# Patient Record
Sex: Female | Born: 1935 | Race: Black or African American | Hispanic: No | Smoking: Former smoker
Health system: Southern US, Community
[De-identification: ages and names within clinical notes are randomized; demographics above are authoritative.]

## PROBLEM LIST (undated history)

## (undated) DIAGNOSIS — I639 Cerebral infarction, unspecified: Secondary | ICD-10-CM

## (undated) DIAGNOSIS — I10 Essential (primary) hypertension: Secondary | ICD-10-CM

## (undated) DIAGNOSIS — G309 Alzheimer's disease, unspecified: Secondary | ICD-10-CM

## (undated) DIAGNOSIS — F0280 Dementia in other diseases classified elsewhere without behavioral disturbance: Secondary | ICD-10-CM

## (undated) HISTORY — PX: VASCULAR SURGERY: SHX849

## (undated) HISTORY — PX: TUBAL LIGATION: SHX77

---

## 1997-10-17 ENCOUNTER — Inpatient Hospital Stay (HOSPITAL_COMMUNITY): Admission: EM | Admit: 1997-10-17 | Discharge: 1997-10-22 | Payer: Self-pay | Admitting: Emergency Medicine

## 1997-10-17 ENCOUNTER — Encounter: Payer: Self-pay | Admitting: Gastroenterology

## 1997-10-20 ENCOUNTER — Encounter: Payer: Self-pay | Admitting: Gastroenterology

## 1998-06-26 ENCOUNTER — Emergency Department (HOSPITAL_COMMUNITY): Admission: EM | Admit: 1998-06-26 | Discharge: 1998-06-26 | Payer: Self-pay | Admitting: Emergency Medicine

## 1999-11-17 ENCOUNTER — Encounter: Payer: Self-pay | Admitting: Emergency Medicine

## 1999-11-17 ENCOUNTER — Inpatient Hospital Stay: Admission: EM | Admit: 1999-11-17 | Discharge: 1999-11-23 | Payer: Self-pay | Admitting: Emergency Medicine

## 1999-11-19 ENCOUNTER — Encounter: Payer: Self-pay | Admitting: Internal Medicine

## 1999-11-23 ENCOUNTER — Inpatient Hospital Stay (HOSPITAL_COMMUNITY): Admission: EM | Admit: 1999-11-23 | Discharge: 1999-12-03 | Payer: Self-pay | Admitting: *Deleted

## 2000-12-28 ENCOUNTER — Emergency Department (HOSPITAL_COMMUNITY): Admission: AC | Admit: 2000-12-28 | Discharge: 2000-12-28 | Payer: Self-pay

## 2000-12-28 ENCOUNTER — Encounter: Payer: Self-pay | Admitting: Emergency Medicine

## 2001-10-25 ENCOUNTER — Encounter: Payer: Self-pay | Admitting: Neurology

## 2001-10-25 ENCOUNTER — Encounter: Payer: Self-pay | Admitting: Emergency Medicine

## 2001-10-25 ENCOUNTER — Inpatient Hospital Stay (HOSPITAL_COMMUNITY): Admission: EM | Admit: 2001-10-25 | Discharge: 2001-11-02 | Payer: Self-pay | Admitting: Emergency Medicine

## 2001-10-26 ENCOUNTER — Encounter: Payer: Self-pay | Admitting: Cardiology

## 2001-10-26 ENCOUNTER — Encounter: Payer: Self-pay | Admitting: Neurology

## 2001-10-29 ENCOUNTER — Encounter: Payer: Self-pay | Admitting: Neurology

## 2001-10-30 ENCOUNTER — Encounter: Payer: Self-pay | Admitting: Neurology

## 2001-10-30 ENCOUNTER — Encounter: Payer: Self-pay | Admitting: Pediatrics

## 2001-10-31 ENCOUNTER — Encounter: Payer: Self-pay | Admitting: Internal Medicine

## 2001-10-31 ENCOUNTER — Encounter: Payer: Self-pay | Admitting: Neurology

## 2001-11-01 ENCOUNTER — Encounter: Payer: Self-pay | Admitting: Neurology

## 2001-11-01 ENCOUNTER — Encounter: Payer: Self-pay | Admitting: Internal Medicine

## 2003-12-07 ENCOUNTER — Emergency Department (HOSPITAL_COMMUNITY): Admission: EM | Admit: 2003-12-07 | Discharge: 2003-12-07 | Payer: Self-pay | Admitting: Emergency Medicine

## 2004-07-12 ENCOUNTER — Emergency Department (HOSPITAL_COMMUNITY): Admission: EM | Admit: 2004-07-12 | Discharge: 2004-07-12 | Payer: Self-pay | Admitting: Emergency Medicine

## 2005-07-10 ENCOUNTER — Inpatient Hospital Stay (HOSPITAL_COMMUNITY): Admission: EM | Admit: 2005-07-10 | Discharge: 2005-07-14 | Payer: Self-pay | Admitting: Emergency Medicine

## 2005-07-12 ENCOUNTER — Encounter: Payer: Self-pay | Admitting: Vascular Surgery

## 2005-07-12 ENCOUNTER — Encounter (INDEPENDENT_AMBULATORY_CARE_PROVIDER_SITE_OTHER): Payer: Self-pay | Admitting: Cardiology

## 2006-08-22 ENCOUNTER — Inpatient Hospital Stay (HOSPITAL_COMMUNITY): Admission: EM | Admit: 2006-08-22 | Discharge: 2006-08-25 | Payer: Self-pay | Admitting: Emergency Medicine

## 2006-08-22 ENCOUNTER — Ambulatory Visit: Payer: Self-pay | Admitting: Vascular Surgery

## 2006-08-23 ENCOUNTER — Encounter (INDEPENDENT_AMBULATORY_CARE_PROVIDER_SITE_OTHER): Payer: Self-pay | Admitting: Internal Medicine

## 2006-08-23 ENCOUNTER — Ambulatory Visit: Payer: Self-pay | Admitting: Cardiology

## 2006-08-24 ENCOUNTER — Ambulatory Visit: Payer: Self-pay | Admitting: Physical Medicine & Rehabilitation

## 2006-08-25 ENCOUNTER — Ambulatory Visit: Payer: Self-pay | Admitting: Physical Medicine & Rehabilitation

## 2006-08-25 ENCOUNTER — Inpatient Hospital Stay (HOSPITAL_COMMUNITY)
Admission: RE | Admit: 2006-08-25 | Discharge: 2006-09-14 | Payer: Self-pay | Admitting: Physical Medicine & Rehabilitation

## 2006-10-13 ENCOUNTER — Encounter
Admission: RE | Admit: 2006-10-13 | Discharge: 2007-01-11 | Payer: Self-pay | Admitting: Physical Medicine & Rehabilitation

## 2006-12-04 ENCOUNTER — Ambulatory Visit: Payer: Self-pay | Admitting: Physical Medicine & Rehabilitation

## 2007-05-25 ENCOUNTER — Emergency Department (HOSPITAL_COMMUNITY): Admission: EM | Admit: 2007-05-25 | Discharge: 2007-05-25 | Payer: Self-pay | Admitting: Emergency Medicine

## 2008-12-16 ENCOUNTER — Emergency Department (HOSPITAL_COMMUNITY): Admission: EM | Admit: 2008-12-16 | Discharge: 2008-12-16 | Payer: Self-pay | Admitting: Emergency Medicine

## 2009-12-24 ENCOUNTER — Emergency Department (HOSPITAL_COMMUNITY)
Admission: EM | Admit: 2009-12-24 | Discharge: 2009-12-24 | Payer: Self-pay | Source: Home / Self Care | Admitting: Emergency Medicine

## 2010-02-07 ENCOUNTER — Encounter: Payer: Self-pay | Admitting: Physical Medicine & Rehabilitation

## 2010-02-07 ENCOUNTER — Encounter: Payer: Self-pay | Admitting: Internal Medicine

## 2010-04-21 LAB — WOUND CULTURE

## 2010-04-21 LAB — GLUCOSE, CAPILLARY: Glucose-Capillary: 96 mg/dL (ref 70–99)
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
## 2010-06-01 NOTE — Assessment & Plan Note (Signed)
Kaitlyn [name] is back regarding her left medullary stroke.  She was at  a SNF for a short time and has now returned home for the last month.  She is at home with her daughter, who usually is there for supervision.  She walks with her walker and does some furniture walking.  She has had  no issues regarding a fall.  She does complain of some right shoulder  pain, which has been quite chronic.  It seems to be a bit worse,  however, lately, as she has been working on strengthening exercises for  the right.  She complains of some tingling on the left face and somewhat  to the right hand.  Her mood has been up and down, although it has been  better since she has been home.  Her PCP placed her back on Remeron for  her antidepressants.  Appetite has been good.  She sleeps fairly well.  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   MEDICATIONS:  Lisinopril, Lopressor, Aggrenox, Zocor, and Remeron.   REVIEW OF SYSTEMS:  Notable for the above as well as occasional  coughing, weight gain, and night sweats.   SOCIAL HISTORY:  Patient is widowed and living with her daughter, as  mentioned above.   PHYSICAL EXAMINATION:  Blood pressure is 135/62, pulse 76.  Respiratory  rate is 16.  She is satting at 97% on room air.  Patient is pleasant, alert and oriented x3.  Affect is bright and  appropriate.  She walks without her walker and tends to use a wide base  and lean sometimes to the right, sometimes to the left, depending on her  position.  She was fairly impulsive with her walking and sometimes did  not stop to take time.  HEART:  Regular.  CHEST:  Clear.  ABDOMEN:  Soft and nontender.  She is a bit overweight still.  Sensation is somewhat diminished in the right hand and the left face,  although she denies specific complaints in the right leg today.  Strength is generally 5/5 in all four extremities.  She had fair fine  motor  coordination of the upper and lower extremity limbs.  Speech is  clear.  Cranial nerve exam is intact.   ASSESSMENT:  1. Left medullary stroke.  2. Hypertension.  3. History of depression.  4. History of dizziness/gait instability.   PLAN:  1. Continue medications for stroke prophylaxis, including Aggrenox and      Zocor.  2. Continue with home health therapy to completion.  I would like to      see her begin a rigorous home exercise program with her daughter,      who is retiring, will help her with beginning next year.  3. Blood pressure control and antidepressant management per Dr.      Concepcion Elk.  4. I will see her back on an as-needed basis.  She has done quite      well.      Ranelle Oyster, M.D.  Electronically Signed     ZTS/MedQ  D:  12/04/2006 11:40:47  T:  12/04/2006 17:19:51  Job #:  [id number]   cc:   Fleet Contras, M.D.  Fax: [phone]
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
## 2010-06-01 NOTE — Discharge Summary (Signed)
NAMELILO, WALLINGTON          ACCOUNT NO.:  [id number]   MEDICAL RECORD NO.:  [id number]          PATIENT TYPE:  INP   LOCATION:  1430                         FACILITY:  [facility]   PHYSICIAN:  Fleet Contras, M.D.    DATE OF BIRTH:  [birth year]   DATE OF ADMISSION:  08/22/2006  DATE OF DISCHARGE:  08/25/2006                               DISCHARGE SUMMARY   HISTORY OF PRESENT ILLNESS:  Ms. Kreitzer is a 77 year old African  American lady with past medical history significant for hypertension,  depression, hiatal hernia, gastroesophageal reflux disease, who  presented to the emergency room at [facility] with a  12 hour history of dizziness, associated with impairment of her speech  that started night before, when she had presumed that this was going to  go away. She went to bed and woke up at 2:00 a.m. to use the bathroom  and she had to hold the wall. The patient had to crawl on her hands and  knees and got back in bed, thinking this was going to pass. She woke up  at about 5:30 a.m. to use the bathroom and again, she slumped onto her  left side. She was then helped by her daughter, back in bed. They tried  to get her to the daycare center that day but noticed that she could not  stand and decided to bring her into the hospital by ambulance. She does  not have any obvious facial droop. There is no report of any seizures or  syncopal episodes. She did have some nausea and vomiting at one time.  The dizziness was in the form of vertigo with the room spinning around.  She was off her balance. In the emergency room all work up essentially  were negative. Her vital signs were stable. A MRI scan of the brain was  performed and this revealed a subacute stroke in the left medulla and  extensive chronic small vessel disease throughout the brain. An MRA of  the brain was normal. I was contacted to meet her for appropriate  management.   HOSPITAL COURSE:  On admission, she  was placed on telemetry bed, kept  NPO, until cleared by the swallow team. Vital signs were checked q.4  hours. She was started on IV fluids of half normal saline at 5 mL an  hour and Protonix 40 mg daily. Her home medications were continued as  usual. Neurologic consultation was requested and the patient was kindly  seen by Dr. Delia Heady, who thought that the left sided weakness and  gait ataxia was due to left medullary infarct and recommended that she  should be started on Aggrenox for secondary prevention. Efforts were  made to control her blood pressure as tightly as possible. Further  laboratory data including a 2-D echocardiogram, carotid Doppler's were  performed and these essentially were normal without obvious carotid  artery stenosis and normal left ventricular ejection fraction. Physical  therapy and occupational therapy consults were requested and the patient  was recommended for rehabilitation. She was evaluated by rehabilitation  team and was accepted for rehabilitation. On August 25, 2006,  she did  transfer to rehabilitation at [facility] where she was treated  until ready for discharge.   DISCHARGE DIAGNOSES:  1. Left sided medullary infarct.  2. Systemic hypertension. She was started on hydrochlorothiazide 12.5      mg daily.  3. Her depression was well controlled and vertigo had improved.   CONDITION ON DISCHARGE:  Stable.   DISPOSITION:  To move to rehabilitation.   DISCHARGE MEDICATIONS:  1. Protonix 40 mg daily.  2. Lisinopril 20 mg once daily.  3. Nortriptyline 50 mg q.h.s.  4. Remeron 50 mg q.h.s.  5. Aspirin 81 mg daily.  6. Aggrenox 1 p.o. b.i.d.  7. HCTZ 12.5 mg once daily.  8. Meclizine 25 mg q.8 hourly p.r.n. for vertigo.   The plan of care was discussed with the patient and her questions were  answered.      Fleet Contras, M.D.  Electronically Signed     EA/MEDQ  D:  10/08/2006  T:  10/08/2006  Job:  [id number]
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
## 2010-06-04 NOTE — Discharge Summary (Signed)
[facility]  Patient:    Kaitlyn [name], Kaitlyn [name]                 MRN: [id number] Adm. Date:  [id number] Disc. Date: [id number] Attending:  Otilio Saber                           Discharge Summary  BRIEF HISTORY:  Kaitlyn [name] is a 77 year old African-American widowed female admitted with a history of severe depression and drug overdose.  She overdosed on a bottle of Percogesic and was transferred from [facility] for continued treatment of depression.  She had been depressed for several weeks, becoming withdrawn and not feeling like herself.  She reported decreased sleep, decreased appetite with 10 pound weight loss and had increasing suicidal ideation just prior to her overdose.  She had been stressed by the recent death of her husband in 1999/02/06.  She also had two cousins who died in 2022/04/06 and 11/06/1999.  She also had severe financial problems and had been worrying excessively.  PAST PSYCHIATRIC HISTORY:  The patient had no previous inpatient or outpatient treatment.  PAST MEDICAL HISTORY:  She is followed medically by Dr. Mayford Knife at [facility].  She had a history of pancreatitis four weeks prior to admission, hiatal hernia, gastroesophageal reflux disease, rheumatoid arthritis, cataracts, microcytic anemia and migraine headaches.  She recently had a urinary tract infection as well some questionable hepatic toxicity secondary to Tylenol overdose.  MEDICATIONS:  At the time of admission she was on Pepcid 20 mg b.i.d., Septra DS b.i.d., Celexa 20 mg q.a.m. and Desyrel 25 mg q.h.s.  ALLERGIES:  No known drug allergies.  PHYSICAL EXAMINATION:  Physical examination was performed at [facility] during her stay from October 31 through November 23, 1999.  She continued to have elevated liver enzymes.  MENTAL STATUS EXAMINATION:  On admission revealed an older, overweight African-American female who was casually  dressed.  She had some psychomotor retardation.  Speech was very slow and soft with poverty of speech.  Thought processes were logical and coherent without evidence of psychosis.  Mood was sad and affect was tearful.  She had continuing suicidal ideation.  She was oriented x 3.  Cognitive functioning was somewhat impaired.  ADMITTING DIAGNOSES: Axis I:    Major depression, single episode, severe. Axis II:   No diagnosis. Axis III:  1. Migraine headaches.            2. Microcytic anemia.            3. Urinary tract infection.            4. Gastroesophageal reflux disease.            5. History of pancreatitis.            6. Rheumatoid arthritis.            7. Bilateral cataracts.            8. Hepatic toxicity. Axis IV:   Psychosocial stressors severe. Axis V:    Global assessment of functioning 35, highest past year 70.  HOSPITAL COURSE:  The patient was admitted to [facility] for treatment of depression and to prevent any further suicidal acting out.  She remained quite depressed and sad and was continued on Celexa.  We increased her Desyrel.  She was initially noncompliant, stating antidepressants were what was causing her depression.  She was confronted about her overdose and encouraged to take her medications.  We switched her from Desyrel to Remeron. The patient became more compliant with her medication and gradually became somewhat less depressed.  She continued to feel quite anxious and we elected to add a small dose of Zyprexa.  We met with the patient and her son and discussed her condition and discharge plans.  The patient remained depressed and tearful with some delusions, stating she had to go to jail.  She could not talk rationally about her fears, about insurance and her bills.  We increased her Zyprexa.  The patient gradually became calmer and less depressed.  we met with the patients daughter who felt she was back to her baseline.  The patient was  having no further suicidal thoughts and it was felt she could be managed on an outpatient basis.  CONDITION ON DISCHARGE:  The patient was discharged in improved condition with improvement in her mood, sleep and appetite, alleviation of her suicidal ideation and alleviation of her excessive worry.  DISPOSITION:  The patient is discharged home.  She is to follow up at the [facility] on December 15, 1999.  DISCHARGE MEDICATIONS:  Celexa 20 mg p.o. q.a.m., Remeron 30 mg q.h.s., Zyprexa 7.5 mg q.h.s., Pepcid 20 mg b.i.d.  FINAL DIAGNOSES: Axis I:    Major depression, with psychotic features, single episode, severe. Axis II:   No diagnosis. Axis III:  1. Migraine headaches.            2. Microcytic anemia.            3. History of urinary tract infection.            4. Gastroesophageal reflux disease.            5. History of pancreatitis.            6. Rheumatoid arthritis.            7. Bilateral cataracts. Axis IV:   Psychosocial stressors severe. Axis V:    Global assessment of functioning current 50, highest past year 70. D:  02/03/00 TD:  02/03/00 Job: [id number]

## 2010-06-04 NOTE — H&P (Signed)
[facility]  Patient:    Kaitlyn [name], Kaitlyn [name]                 MRN: [id number] Adm. Date:  [id number] Attending:  Otilio Saber Dictator:   Valinda Hoar, N.P.                         History and Physical  IDENTIFYING INFORMATION:  Kaitlyn [name] is a 77 year old African-American widowed female admitted November 23, 1999 on a voluntary basis for severe depression.  The patient overdosed on one bottle of Percogesic last week, approximately November 17, 1999.  She was admitted from [facility] to the [facility] for continued treatment of her depression.  HISTORY OF PRESENT ILLNESS:  The patient reports she has been depressed for the last couple weeks and has been very depressed.  She states she still does not feel like herself.  She is withdrawn.  Collateral information states on the day of the overdose family could not reach her by phone.  They had to break the door down and found her on the floor unresponsive.  They took her to [facility].  The patient does say her sleep continues to be poor, averaging 3-4 hours a night and this has been going on for one year.  Appetite is poor.  She thinks she has lost about 10 pounds in one year.  She states she took the Percogesic pills to stop a headache.  She took them at night and the she says "maybe I wanted to die."  The patient states she still feels suicidal, hopeless and does not want to live.  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  She denies hallucinations, no delusions.  Stressors include the recent death of her husband in 02/22/1999.  Two cousins died in 04-05-2001and 1999/11/22.  She apparently was close to these cousins.  She has had severe financial problems, particularly since her husband died. Her husband was in a nursing home before he died.  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  PAST PSYCHIATRIC HISTORY:  No previous inpatient or outpatient treatment. No history of a suicide attempt.  She does report she got depressed when her mother died two years ago, however, she coped on her own and it resolved.  PAST MEDICAL HISTORY:  Patient states she does not have a primary care doctor. She was recently treated by Dr. Mayford Knife at [facility].  Medical problems include recurrent pancreatitis (last episode four weeks ago), hiatal hernia, gastroesophageal reflux disease, rheumatoid arthritis, cataracts both eyes, microcytic anemia and migraine headaches.  She has a urinary tract infection and question of some hepatotoxicity secondary to the Tylenol overdose.  CURRENT MEDICATIONS:  Pepcid 20 mg b.i.d., Septra DS b.i.d. x 2 days, Celexa 20 mg p.o. q.a.m. and Desyrel 25 mg q.h.s. p.o.  DRUG ALLERGIES:  No known drug allergies.  SOCIAL HISTORY:  The patient lives alone.  Her husband died in 1999-02-22. They had been married for 50 years.  She has two sons and one daughter and the daughter lives next to her.  She has one sister and four brothers.  Two sisters died, probably in infancy.  Her parents are deceased.  She completed the June 22, 2022 grade.  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  She has a strong religious faith.  She is a Curator.  FAMILY HISTORY:  None.  ALCOHOL OR DRUG HISTORY:  She denies alcohol use, denies substance abuse, is a nonsmoker.  POSITIVE PHYSICAL FINDINGS:  Please see physical examination done at [facility] between November 17, 1999 and November 23, 1999.  She was admitted to [facility] after an overdose with Percogesic with hepatotoxicity secondary to the Tylenol overdose.  Her liver enzymes are still not within normal limits and there is some question of liver damage secondary to the overdose.  Again she was at [facility] from November 17, 1999 through November 23, 1999.  VITAL SIGNS:  Current  temperature 98.5, pulse 98, respirations 18, blood pressure 142/72, height 5 feet, weight 188 pounds.  CURRENT MENTAL STATUS EXAMINATION:  An older overweight African-American female who is casually dressed.  She has some psychomotor retardation and moves very slowly.  The patient is cooperative.  Speech is very very low and slow, some poverty of speech and difficult to understand since she speaks in such low tone.  Most of the time her speech is relevant.  Mood is tearful, very sad.  Affect is depressed, positive for suicidal ideation, no homicidal ideation currently.  Thought processes are logical and coherent without evidence of psychosis.  No hallucinations, no delusions.  Cognitively, she is alert and oriented.  Her cognitive functioning appears intact.  We will do a Folstein mini mental status exam to determine how intact it is.  Her judgment is poor.  Insight is poor.  CURRENT DIAGNOSES: Axis I:    Major depression, single episode, severe with suicide attempt. Axis II:   None. Axis III:  1. Migraine headaches.            2. Microcytic anemia.            3. Urinary tract infection.            4. Gastroesophageal reflux disease.            5. History of pancreatitis.            6. Rheumatoid arthritis.            7. Cataracts both eyes.            8. Question of hepatotoxicity secondary to the overdose. Axis IV:   Severe related to problems with primary support group and economic            problems and medical problems. Axis V:    Global assessment of functioning current 35, highest in past year            70.  TREATMENT PLAN AND RECOMMENDATIONS: 1. VOluntary admission to [facility]. 2. Check every 15 minutes, maintain safety.  The patient is able to contract    for safety. 3. Due to her possibility of hepatotoxicity and liver enzymes not within    normal limits, she is going to be on Celexa 20 mg p.o. q.d.  She was on    this at [facility].   Trazodone 25 mg p.o. q.h.s. for sleep.  She was on    this at [facility], Pepcid 20 mg p.o. b.i.d., Septra DS 1 tab p.o. q.12h.    x 2 days.  4. Will try to have the patient attend some groups.  TENTATIVE LENGTH OF STAY:  Three to five days. DD:  11/24/99 TD:  11/24/99 Job: [id number]

## 2010-06-04 NOTE — Discharge Summary (Signed)
[facility]  Patient:    Kaitlyn [name], Kaitlyn [name]                 MRN: [id number] Adm. Date:  [id number] Disc. Date: 11/23/99 Attending:  Miguel Aschoff CC:         [facility]             Kaitlyn [name], M.D.             Kaitlyn [name], M.D.                           Discharge Summary  DATE OF BIRTH:  04-19-1925  DISCHARGE DIAGNOSES: 1. Hepatotoxicity secondary to Tylenol overdose, suicide attempt. 2. Severe major depression. 3. Group B strep (agalactiae) urinary tract infection. 4. Gastroesophageal reflux disease. 5. History of recurrent idiopathic pancreatitis. a)Status post cholecystectomy    in 1997. b) Status post sphincterectomy in 1998. 6. History of microcytic anemia in the past. a) Negative colonoscopy 1997.    b) Hemoglobin 13.6, MCV 87 during this admission. 7. History of leaking planus. 8. Status post hysterectomy. 9. Transient hypokalemia, resolved.  DISCHARGE MEDICATIONS: 1. Pepcid 20 mg p.o. b.i.d. 2. Septra DS 1 table b.i.d. x 2 more days. 3. Celexa 20 mg p.o. q.d. 4. Trazodone 25 mg q.h.s.  DISCHARGE FOLLOW-UP AND DISPOSITION:  Kaitlyn [name] is being transferred to [facility] to continue with the inpatient therapy for her major depression and concurrent suicidal ideation. Upon discharge from this facility, Dr. Madilyn [name], Kaitlyn [name] will continue following this patient from the [name] standpoint including gastroesophageal reflux disease and recurrent pancreatitis.  A psychiatrist will be assigned to this patient prior being discharged from the behavioral health center who will be in charge of managing the patients antidepressive agents.  We gave to Kaitlyn [name] family Kaitlyn [name] office number to arrange an appointment upon discharge if general medical problems were to occur.  We recommend to follow another set of LFTs within 3 days to confirm steady decline. Also a  TSH should be obtained on ______. A urine culture should be repeated within 1-2 weeks to confirm the total resolution of the patients UTI.  CONSULTATIONS:  Kaitlyn [name], psychiatry.  PROCEDURES:  CT scan of the brain, negative.  DISCHARGE LABORATORY DATA:  Alkaline phosphatase 213, SGOT 50, SGPT 109, total bilirubin 0.7, albumin 3.0, sodium 140, potassium 4.1, chloride 108, CO2 27, BUN 17, creatinine 0.8, glucose 107. Urine culture consistent with group B strep (agalactiae). Protime 15.1. Cardiac enzymes negative. Blood culture negative x 1 (final results). Hemoglobin 13.6, MCV 87, WBC 8.3, platelets 202.  HOSPITAL COURSE BY PROBLEM:  Kaitlyn [name] is a very pleasant 77 year old female admitted to [facility] on November 16, 1999 with altered mental status associated with Percogesic overdose in a suicide attempt. Please see admission H&P by Dr. Charissa [name] for further details regarding the history of presentation, physical exam and lab data.  #1 -  SEVERE TYLENOL OVERDOSE ASSOCIATED WITH HEPATOTOXICITY IN A SUICIDE ATTEMPT.  Kaitlyn [name] was admitted without altered mental status associated with the overdose on Percogesic in a suicidal attempt. The Tylenol level 10 hours after injection was 337. The first set of LFTs were within normal limits except a total bilirubin of 1.5. The patient received activated charcoal in the emergency department. ______ was started by protocol. The patients lethargy  started to improve after overnight observation with supportive care. The second set of LFTs started to show an increased ALT which peaked at 234 on day 3 of hospital stay. The ALP also peaked at 222 on November 22, 1999. The following day, this liver function test started to improve. The Tylenol level on day 2 was undetectable. A psychiatry consult was obtained due to the patients severe signs of major depression since the death of her husband in 02-19-22 of this year. Dr.  Jeanie [name] agreed that Kaitlyn [name] had a major depression. The patient was started on Celexa and then trazadone was started prior to her transfer to the [facility]. Kaitlyn [name] agreed with a voluntary commitment to this facility to continue with the intensive therapy for her major depression. At the time of discharge, the patient showed no signs of lethargy. Her liver function tests showed a steady pattern of improvement. She finished with her ______ protocol doses prior to this transfer. Further follow-up on the patients LFTs will be performed at the [facility] within 3 days. Upon discharge, Dr. Barbee Shropshire will be available for general medical follow-up if needed. #2 - HYPOKALEMIA. Kaitlyn [name] had persistent hypokalemia in the 2.9 to 3.4 range throughout this hospital stay. On the day of transfer, the hypokalemia had resolved. No cardiac complications were noted associated with this electrolyte abnormality. #3 - URINARY TRACT INFECTION.  Kaitlyn [name] had a low grade fever associated with dysuria. Urinalysis showed pyuria. The urine culture isolated the strep (agalactiae). Septra was started without complications. Kaitlyn [name] will remain on this medication for 2 more days upon discharge. A urine culture is recommended within 1-2 weeks to confirm the total resolution of this infection.  The rest of the patients medical problems remain stable.  CONDITION ON DISCHARGE:  Improved. DD:  11/23/99 TD:  11/23/99 Job: [id number]
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
## 2010-06-04 NOTE — Discharge Summary (Signed)
NAMEBRILEE, Kaitlyn [name]          ACCOUNT NO.:  [id number]   MEDICAL RECORD NO.:  [id number]          PATIENT TYPE:  INP   LOCATION:  1404                         FACILITY:  [facility]   PHYSICIAN:  Fleet Contras, M.D.    DATE OF BIRTH:  Jul 25, 1925   DATE OF ADMISSION:  07/10/2005  DATE OF DISCHARGE:  07/14/2005                                 DISCHARGE SUMMARY   HISTORY OF PRESENTING ILLNESS:  Kaitlyn [name] is a 77 year old African  American lady with a past medical history significant for depression and  previous cerebrovascular accident.  She presented to the emergency room at  [facility] with sudden onset of dizziness, blank-staring eyes  observed by her daughter, numbness of the left hand of one day's duration.  She was immediately brought to the emergency room for evaluation.  In the  emergency room, she was alert and oriented.  Her vital signs were stable  except for an elevated blood pressure of 155/79.  Her laboratory testing was  normal including a CT scan of the head which was negative for any acute  process.  Glucose was elevated at 145, and hemoglobin A1c was 6.6.  She was  admitted to the hospital for further evaluation and appropriate management.   HOSPITAL COURSE:  On admission, she was placed on telemetry.  Further  laboratory studies were performed including a 2-D echocardiogram of her  heart which showed ejection fraction of 60% without any intracardiac embolus  or thrombus.  There was mild septal hypertrophy.  Also, a carotid Doppler  was performed, and this did not reveal any significant coronary stenosis.  Her homocysteine level was normal at 13.3.  RBC folate 680, vitamin B12 was  767, C-reactive protein was 1.7, sed rate was elevated at 44, hemoglobin A1c  was 6.6, TSH 0.927.  Serial CKs and troponins times three were all negative.  Lipid profile shows total cholesterol 150, triglycerides 45, HDL 45, LDL 96,  and total cholesterol/HDL ratio of 3.3.  She  had occasional episodes of mild  headaches which were relieved by Tylenol.  Her blood pressure was elevated  at times up to 157/78.  She was therefore started on oral lisinopril 10 mg  daily.  She was on aspirin 325 mg once a day since from admission.  She  tolerated this well.  She has had no further episodes of dizziness,  disorientation, paresthesias.  Today, she is feeling much better, and she is  sitting on the edge of the bed eating her breakfast.  Her blood pressure is  120/70.  Chest is clear to auscultation without any other sounds.  Heart:  S1, S2 are heard with no murmurs.  Abdomen is soft and nontender.  No  masses.  Bowel sounds are present.  Extremities show no edema.  No calf  tenderness.  No swelling.  CNS:  She is alert and oriented times three with  no focal neurological deficits.   ASSESSMENT:  Kaitlyn [name] is a 77 year old lady with past medical  history significant for depression and previous cerebrovascular accident  without focal neurological deficit.  She presented this time with  symptoms  suggestive of transient ischemic attack.  Her symptoms have not completely  resolved. The workup, so far, has been negative except for elevated blood  pressure for which she was started on medications.  She also had impaired  glucose tolerance, and she has been placed on a diet without concentrated  sweets.  She is considered appropriate for discharge home today.  Her  condition has been discussed with her daughter, and all questions were  answered.   DISCHARGE DIAGNOSES:  1.  Transient ischemic attack, now resolved.  2.  Hypertension, well controlled.  3.  Depression, stable.  4.  Impaired glucose tolerance with hemoglobin A1c of 6.6.  This will be      monitored in the outpatient department.   DISPOSITION:  Home.   CONDITION ON DISCHARGE:  Stable.   FOLLOW-UP:  Follow-up will be with me in two weeks.   DISCHARGE MEDICATIONS:  1.  Aspirin 325 mg once daily.   2.  Nortriptyline 50 mg at bedtime.  3.  Lisinopril 10 mg daily.      Fleet Contras, M.D.  Electronically Signed     EA/MEDQ  D:  07/14/2005  T:  07/14/2005  Job:  [id number]
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
## 2010-10-13 LAB — URINALYSIS, ROUTINE W REFLEX MICROSCOPIC
Glucose, UA: NEGATIVE
Hgb urine dipstick: NEGATIVE
Nitrite: NEGATIVE
Protein, ur: NEGATIVE
Urobilinogen, UA: 1
pH: 6

## 2010-10-13 LAB — BASIC METABOLIC PANEL
CO2: 29
Calcium: 9.4
GFR calc Af Amer: >60
GFR calc non Af Amer: >60
Sodium: 142

## 2010-10-29 LAB — BASIC METABOLIC PANEL
Calcium: 9
Creatinine, Ser: 0.93
GFR calc Af Amer: >60
Potassium: 4.2

## 2010-11-01 LAB — BASIC METABOLIC PANEL
CO2: 29
CO2: 30
Calcium: 9.1
Chloride: 106
Creatinine, Ser: 0.77
Creatinine, Ser: 0.87
GFR calc non Af Amer: >60
Glucose, Bld: 131 — ABNORMAL HIGH
Glucose, Bld: 148 — ABNORMAL HIGH
Potassium: 4.1
Sodium: 141

## 2010-11-01 LAB — COMPREHENSIVE METABOLIC PANEL
ALT: 43 — ABNORMAL HIGH
AST: 36
Alkaline Phosphatase: 120 — ABNORMAL HIGH
Alkaline Phosphatase: 124 — ABNORMAL HIGH
BUN: 10
CO2: 27
CO2: 28
Calcium: 9.2
Chloride: 106
Creatinine, Ser: 0.79
GFR calc Af Amer: >60
GFR calc non Af Amer: 55 — ABNORMAL LOW
GFR calc non Af Amer: >60
Glucose, Bld: 121 — ABNORMAL HIGH
Potassium: 3.6
Potassium: 4.4
Sodium: 142
Total Bilirubin: 1.1
Total Protein: 5.9 — ABNORMAL LOW

## 2010-11-01 LAB — ANA: Anti Nuclear Antibody(ANA): NEGATIVE

## 2010-11-01 LAB — CBC
HCT: 36.3
HCT: 38.1
Hemoglobin: 12.3
Hemoglobin: 12.9
MCHC: 33.8
MCHC: 33.8
MCV: 90.9
Platelets: 197
RBC: 3.67 — ABNORMAL LOW
RDW: 13.8
WBC: 7.9

## 2010-11-01 LAB — DIFFERENTIAL
Eosinophils Absolute: 0.2
Eosinophils Relative: 3
Lymphs Abs: 2.3
Monocytes Relative: 9

## 2010-11-01 LAB — SEDIMENTATION RATE: Sed Rate: 46 — ABNORMAL HIGH

## 2010-11-01 LAB — HOMOCYSTEINE: Homocysteine: 9.1

## 2010-11-01 LAB — LIPID PANEL
Cholesterol: 137
HDL: 35 — ABNORMAL LOW
Total CHOL/HDL Ratio: 3.9
VLDL: 16

## 2010-11-01 LAB — TSH: TSH: 0.612

## 2010-11-01 LAB — PROTIME-INR
INR: 1
Prothrombin Time: 13

## 2013-05-24 ENCOUNTER — Encounter (HOSPITAL_COMMUNITY): Payer: Self-pay | Admitting: Emergency Medicine

## 2013-05-24 ENCOUNTER — Emergency Department (HOSPITAL_COMMUNITY)
Admission: EM | Admit: 2013-05-24 | Discharge: 2013-05-24 | Disposition: A | Discharge status: Home / Self Care | Payer: Medicare Other | Attending: Emergency Medicine | Admitting: Emergency Medicine

## 2013-05-24 DIAGNOSIS — T413X5A Adverse effect of local anesthetics, initial encounter: Secondary | ICD-10-CM | POA: Insufficient documentation

## 2013-05-24 DIAGNOSIS — R259 Unspecified abnormal involuntary movements: Secondary | ICD-10-CM | POA: Insufficient documentation

## 2013-05-24 HISTORY — DX: Essential (primary) hypertension: I10

## 2013-05-24 HISTORY — DX: Cerebral infarction, unspecified: I63.9

## 2013-05-24 MED ORDER — LORAZEPAM 2 MG/ML IJ SOLN
0.5000 mg | Freq: Once | INTRAMUSCULAR | Status: AC
  Administered 2013-05-24: 0.5 mg via INTRAVENOUS
  Filled 2013-05-24: qty 1

## 2013-05-24 NOTE — ED Notes (Addendum)
Pt still having minimal but frank bright red blood from mouth. Dentures removed per dental instructions with patient from dentist seen today. Mouth repacked with gauze. Awaiting MD re-eval.

## 2013-05-24 NOTE — Discharge Instructions (Signed)

## 2013-05-24 NOTE — ED Provider Notes (Signed)
CSN: [id number]     Arrival date & time 05/24/13  1545 History   First MD Initiated Contact with Patient 05/24/13 1547     Chief Complaint  Patient presents with  . Medication Reaction     (Consider location/radiation/quality/duration/timing/severity/associated sxs/prior Treatment) HPI  80 year old female BIB EMS from dentist office for reported adverse reaction to lidocain given by dentist 1 hr ago.  History obtained through daughter who is at bedside. Patient ha  a dental extraction done this afternoon in preparation for her denture. While receiving the lidocaine injection patient developed body tremors including headshaking, arm shaking and repeatedly banging her head with her arms. Symptoms lasting for about 5 minutes. This has happened before she had her extraction. As cautionary measure, the dentist sent her to the ED for further management.  Aside from dental pain, patient denies fever, chills, headache, chest pain, shortness of breath, abdominal pain, nausea vomiting diarrhea, throat swelling or trouble swallowing. The symptom has resolved and patient is now back to her normal baseline. Patient does have a history of prior stroke with the last stroke in 2004.   No past medical history on file. No past surgical history on file. No family history on file. History  Substance Use Topics  . Smoking status: Not on file  . Smokeless tobacco: Not on file  . Alcohol Use: Not on file   OB History   No data available     Review of Systems  All other systems reviewed and are negative.     Allergies  Review of patient's allergies indicates not on file.  Home Medications   Prior to Admission medications   Not on File   There were no vitals taken for this visit. Physical Exam  Nursing note and vitals reviewed. Constitutional: She is oriented to person, place, and time. She appears well-developed and well-nourished. No distress.  HENT:  Head: Atraumatic.  Several newly  extracted sockets to both upper and lower jaw from recent dental extraction.  Bloody gauze in mouth. No tongue edema, no trismus or throat swelling.  Eyes: Conjunctivae are normal.  Neck: Neck supple.  Cardiovascular: Normal rate, regular rhythm and intact distal pulses.   Pulmonary/Chest: Effort normal and breath sounds normal. She has no wheezes.  Neurological: She is alert and oriented to person, place, and time.  5/5 strength to all 4 extremities.  No tremors noted.    Skin: No rash noted.  Psychiatric: She has a normal mood and affect.    ED Course  Procedures (including critical care time)  Pt has an episode of body tremor lasting 5 min after administration of lidocane and before dental extraction.  Tremor noted at 1300.  No active tremor at this time.  Lido can induce neurologic findings such as tremor.  Pt currently in NAD.  Will monitor.    6:21 PM Pt has several witnessed tremors lasting for 10-15 seconds.  No seizure activity.  Pt also continues to bleed from her dental extraction site.  She was on plavix (one dose was hold last night).  Will continue to monitor.  Care discussed with Dr. Gwendolyn GrantWalden.    8:57 PM Pt is currently sxs free.  She has been monitored for the past 5 hrs.  She is stable for discharge.  Pt and family member agrees with plan.  Pt to f/u with PCP as needed.  Pt will need to notify provider of her lidocaine allergy.  Return precaution discussed.    Labs Review  Labs Reviewed - No data to display  Imaging Review No results found.   EKG Interpretation None      MDM   Final diagnoses:  Local anesthetic drug adverse reaction    BP 140/60  Pulse 92  Temp(Src) 97.9 F (36.6 C) (Oral)  Resp 18  SpO2 98%     Fayrene HelperBowie Nikash Mortensen, PA-C 05/24/13 2059

## 2013-05-24 NOTE — ED Notes (Signed)
Pt brought in by EMS from Dentist, reports adverse reaction to Licodane after pt had tremors. Lung sounds clear, pt in NAD.

## 2013-05-24 NOTE — ED Provider Notes (Signed)
Medical screening examination/treatment/procedure(s) were conducted as a shared visit with non-physician practitioner(s) and myself.  I personally evaluated the patient during the encounter.   EKG Interpretation None       [name] here s/p dental extraction. Had episode of arm shaking without loss of consciousness/CP/SOB after getting lidocaine. Had dental extraction done afterwards then sent here for eval.  Doing well here, no complaints. Had one episode lasting about 10 seconds of arms flailing, appearing like tremors. Again no LOC, SOB, CP with this episode. Unclear etiology, however doubt anaphylaxis, seizure. Doubt lidocaine toxicity. Stable for discharge.  Kaitlyn HaitWilliam Anaissa Macfadden, MD 05/24/13 [phone]

## 2013-05-24 NOTE — ED Notes (Signed)
Bed: WA08 Expected date:  Expected time:  Means of arrival:  Comments: EMS-tremors from lidocaine

## 2013-06-27 ENCOUNTER — Other Ambulatory Visit: Payer: Self-pay | Admitting: Internal Medicine

## 2013-06-27 DIAGNOSIS — Z1231 Encounter for screening mammogram for malignant neoplasm of breast: Secondary | ICD-10-CM

## 2014-03-31 ENCOUNTER — Emergency Department (HOSPITAL_COMMUNITY)
Admission: EM | Admit: 2014-03-31 | Discharge: 2014-03-31 | Disposition: A | Discharge status: Home / Self Care | Payer: Medicare Other | Attending: Emergency Medicine | Admitting: Emergency Medicine

## 2014-03-31 ENCOUNTER — Encounter (HOSPITAL_COMMUNITY): Payer: Self-pay | Admitting: Emergency Medicine

## 2014-03-31 DIAGNOSIS — R251 Tremor, unspecified: Secondary | ICD-10-CM

## 2014-03-31 DIAGNOSIS — Z87891 Personal history of nicotine dependence: Secondary | ICD-10-CM | POA: Diagnosis not present

## 2014-03-31 DIAGNOSIS — Z8673 Personal history of transient ischemic attack (TIA), and cerebral infarction without residual deficits: Secondary | ICD-10-CM | POA: Insufficient documentation

## 2014-03-31 DIAGNOSIS — I10 Essential (primary) hypertension: Secondary | ICD-10-CM | POA: Diagnosis not present

## 2014-03-31 LAB — BASIC METABOLIC PANEL
ANION GAP: 8 (ref 5–15)
BUN: 21 mg/dL (ref 6–23)
CALCIUM: 9.2 mg/dL (ref 8.4–10.5)
CO2: 30 mmol/L (ref 19–32)
CREATININE: 0.9 mg/dL (ref 0.50–1.10)
Chloride: 104 mmol/L (ref 96–112)
GFR, EST AFRICAN AMERICAN: 65 mL/min — AB (ref >90)
GFR, EST NON AFRICAN AMERICAN: 56 mL/min — AB (ref >90)
Glucose, Bld: 91 mg/dL (ref 70–99)
Potassium: 4.5 mmol/L (ref 3.5–5.1)
Sodium: 142 mmol/L (ref 135–145)

## 2014-03-31 LAB — I-STAT CHEM 8, ED
BUN: 21 mg/dL (ref 6–23)
Calcium, Ion: 1.22 mmol/L (ref 1.13–1.30)
Chloride: 102 mmol/L (ref 96–112)
Creatinine, Ser: 0.9 mg/dL (ref 0.50–1.10)
Glucose, Bld: 87 mg/dL (ref 70–99)
HEMATOCRIT: 39 % (ref 36.0–46.0)
HEMOGLOBIN: 13.3 g/dL (ref 12.0–15.0)
Potassium: 4.5 mmol/L (ref 3.5–5.1)
SODIUM: 143 mmol/L (ref 135–145)
TCO2: 27 mmol/L (ref 0–100)

## 2014-03-31 LAB — URINALYSIS, ROUTINE W REFLEX MICROSCOPIC
BILIRUBIN URINE: NEGATIVE
Glucose, UA: NEGATIVE mg/dL
HGB URINE DIPSTICK: NEGATIVE
Ketones, ur: NEGATIVE mg/dL
NITRITE: NEGATIVE
PH: 7.5 (ref 5.0–8.0)
Protein, ur: NEGATIVE mg/dL
SPECIFIC GRAVITY, URINE: 1.021 (ref 1.005–1.030)
Urobilinogen, UA: 2 mg/dL — ABNORMAL HIGH (ref 0.0–1.0)

## 2014-03-31 LAB — CBC
HCT: 37.3 % (ref 36.0–46.0)
HEMOGLOBIN: 12.1 g/dL (ref 12.0–15.0)
MCH: 31.5 pg (ref 26.0–34.0)
MCHC: 32.4 g/dL (ref 30.0–36.0)
MCV: 97.1 fL (ref 78.0–100.0)
Platelets: 150 10*3/uL (ref 150–400)
RBC: 3.84 MIL/uL — ABNORMAL LOW (ref 3.87–5.11)
RDW: 13.5 % (ref 11.5–15.5)
WBC: 5.7 10*3/uL (ref 4.0–10.5)

## 2014-03-31 LAB — URINE MICROSCOPIC-ADD ON

## 2014-03-31 NOTE — ED Notes (Signed)
Bed: WA12 Expected date:  Expected time:  Means of arrival:  Comments: Hold for triage 

## 2014-03-31 NOTE — ED Provider Notes (Signed)
CSN: [id number]     Arrival date & time 03/31/14  1200 History   First MD Initiated Contact with Patient 03/31/14 1503     Chief Complaint  Patient presents with  . Tremors    HPI Pt started having tremors this morning.  She doesn't have them on a daily basis but has had that issue in the past but it was a long time ago.  The shaking seemed to occur in the arms up including her head.  No tremors noted in the legs.  She noticed it when she was resting and not moving as well as when she was moving.  No LOC.  No fevers or chills.  No pain or weakness.  No vomiting or diarrhea.  No recent medication changes.  Family was concerned she could be having a stroke so she was brought to the ED. Past Medical History  Diagnosis Date  . Stroke   . Hypertension    Past Surgical History  Procedure Laterality Date  . Vascular surgery    . Tubal ligation     No family history on file. History  Substance Use Topics  . Smoking status: Former Games developer  . Smokeless tobacco: Not on file  . Alcohol Use: No   OB History    No data available     Review of Systems  All other systems reviewed and are negative.     Allergies  Other  Home Medications   Prior to Admission medications   Medication Sig Start Date End Date Taking? Authorizing Provider  clobetasol ointment (TEMOVATE) 0.05 % Apply 1 application topically 2 (two) times daily. Apply to affected area.   Yes Historical Provider, MD  ergocalciferol (VITAMIN D2) 50000 UNITS capsule Take 50,000 Units by mouth once a week. Takes on Mondays   Yes Historical Provider, MD  lisinopril (PRINIVIL,ZESTRIL) 20 MG tablet Take 20 mg by mouth daily.   Yes Historical Provider, MD   BP 156/54 mmHg  Pulse 70  Temp(Src) 98.4 F (36.9 C) (Oral)  Resp 16  SpO2 98% Physical Exam  Constitutional: She is oriented to person, place, and time. She appears well-nourished. No distress.  HENT:  Head: Normocephalic and atraumatic.  Right Ear: External ear normal.   Left Ear: External ear normal.  Mouth/Throat: Oropharynx is clear and moist.  Eyes: Conjunctivae are normal. Right eye exhibits no discharge. Left eye exhibits no discharge. No scleral icterus.  Neck: Neck supple. No tracheal deviation present.  Cardiovascular: Normal rate, regular rhythm and intact distal pulses.   Pulmonary/Chest: Effort normal and breath sounds normal. No stridor. No respiratory distress. She has no wheezes. She has no rales.  Abdominal: Soft. Bowel sounds are normal. She exhibits no distension. There is no tenderness. There is no rebound and no guarding.  Musculoskeletal: She exhibits no edema or tenderness.  Neurological: She is alert and oriented to person, place, and time. She has normal strength. She displays tremor. No cranial nerve deficit (No facial droop, extraocular movements intact, tongue midline ) or sensory deficit. She exhibits normal muscle tone. She displays no seizure activity. Coordination normal.  No pronator drift bilateral upper extrem, able to hold both legs off bed for 5 seconds, sensation intact in all extremities, no visual field cuts, no left or right sided neglect, normal finger-nose exam bilaterally with intention tremor that increases with movemement, no nystagmus noted   Skin: Skin is warm and dry. No rash noted. She is not diaphoretic.  Psychiatric: She has a normal mood  and affect.  Nursing note and vitals reviewed.   ED Course  Procedures (including critical care time) Labs Review Labs Reviewed  CBC - Abnormal; Notable for the following:    RBC 3.84 (*)    All other components within normal limits  BASIC METABOLIC PANEL - Abnormal; Notable for the following:    GFR calc non Af Amer 56 (*)    GFR calc Af Amer 65 (*)    All other components within normal limits  URINALYSIS, ROUTINE W REFLEX MICROSCOPIC - Abnormal; Notable for the following:    Urobilinogen, UA 2.0 (*)    Leukocytes, UA SMALL (*)    All other components within normal  limits  URINE CULTURE  URINE MICROSCOPIC-ADD ON  I-STAT CHEM 8, ED      EKG Interpretation   Date/Time:  Monday March 31 2014 12:41:32 EDT Ventricular Rate:  68 PR Interval:  138 QRS Duration: 68 QT Interval:  390 QTC Calculation: 414 R Axis:   12 Text Interpretation:  Normal sinus rhythm Possible Left atrial enlargement  Borderline ECG No significant change since last tracing except p wave  changes Confirmed by Damek Ende  MD-J, Nichol Ator ([id number]) on 03/31/2014 3:08:15 PM      MDM   Final diagnoses:  Tremor   Pt has bilateral tremor of her upper extremities that does increase with movement but is present somewhat at rest.  Doubt stroke, TIA with her bilateral findings.  No trouble with her gait.  No other neuro findings.  Labs are reassuring.  Will send of urine culture but not definitive for UTI.  Discussed findings with patient and daughter.  Follow up with PCP this week.  At this time there does not appear to be any evidence of an acute emergency medical condition and the patient appears stable for discharge with appropriate outpatient follow up.     Linwood DibblesJon Jaramie Bastos, MD 03/31/14 [phone]

## 2014-03-31 NOTE — ED Notes (Signed)
Pt ambulatory with cane. 
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
## 2014-03-31 NOTE — ED Notes (Signed)
Patient denies feeling ill.

## 2014-03-31 NOTE — Discharge Instructions (Signed)

## 2014-04-01 LAB — URINE CULTURE: Colony Count: >=100000

## 2014-12-26 ENCOUNTER — Emergency Department (HOSPITAL_COMMUNITY)
Admission: EM | Admit: 2014-12-26 | Discharge: 2014-12-26 | Disposition: A | Discharge status: Other Acute Inpatient Hospital | Payer: Medicare Other | Attending: Emergency Medicine | Admitting: Emergency Medicine

## 2014-12-26 ENCOUNTER — Encounter (HOSPITAL_COMMUNITY): Payer: Self-pay | Admitting: Emergency Medicine

## 2014-12-26 ENCOUNTER — Emergency Department (HOSPITAL_COMMUNITY): Payer: Medicare Other

## 2014-12-26 DIAGNOSIS — Z79899 Other long term (current) drug therapy: Secondary | ICD-10-CM | POA: Diagnosis not present

## 2014-12-26 DIAGNOSIS — Z7952 Long term (current) use of systemic steroids: Secondary | ICD-10-CM | POA: Insufficient documentation

## 2014-12-26 DIAGNOSIS — Z751 Person awaiting admission to adequate facility elsewhere: Secondary | ICD-10-CM

## 2014-12-26 DIAGNOSIS — Z87891 Personal history of nicotine dependence: Secondary | ICD-10-CM | POA: Diagnosis not present

## 2014-12-26 DIAGNOSIS — I10 Essential (primary) hypertension: Secondary | ICD-10-CM | POA: Insufficient documentation

## 2014-12-26 DIAGNOSIS — F339 Major depressive disorder, recurrent, unspecified: Secondary | ICD-10-CM | POA: Diagnosis not present

## 2014-12-26 DIAGNOSIS — F919 Conduct disorder, unspecified: Secondary | ICD-10-CM | POA: Insufficient documentation

## 2014-12-26 DIAGNOSIS — Z8673 Personal history of transient ischemic attack (TIA), and cerebral infarction without residual deficits: Secondary | ICD-10-CM | POA: Diagnosis not present

## 2014-12-26 DIAGNOSIS — Z7902 Long term (current) use of antithrombotics/antiplatelets: Secondary | ICD-10-CM | POA: Diagnosis not present

## 2014-12-26 DIAGNOSIS — R45851 Suicidal ideations: Secondary | ICD-10-CM | POA: Diagnosis present

## 2014-12-26 LAB — URINALYSIS, ROUTINE W REFLEX MICROSCOPIC
BILIRUBIN URINE: NEGATIVE
Glucose, UA: NEGATIVE mg/dL
Hgb urine dipstick: NEGATIVE
Ketones, ur: NEGATIVE mg/dL
Nitrite: NEGATIVE
PH: 6.5 (ref 5.0–8.0)
Protein, ur: NEGATIVE mg/dL
Specific Gravity, Urine: 1.022 (ref 1.005–1.030)

## 2014-12-26 LAB — CBC WITH DIFFERENTIAL/PLATELET
Basophils Absolute: 0 10*3/uL (ref 0.0–0.1)
Basophils Relative: 1 %
Eosinophils Absolute: 0.1 10*3/uL (ref 0.0–0.7)
Eosinophils Relative: 1 %
HCT: 37.8 % (ref 36.0–46.0)
HEMOGLOBIN: 12.1 g/dL (ref 12.0–15.0)
LYMPHS ABS: 2.6 10*3/uL (ref 0.7–4.0)
LYMPHS PCT: 51 %
MCH: 30.6 pg (ref 26.0–34.0)
MCHC: 32 g/dL (ref 30.0–36.0)
MCV: 95.7 fL (ref 78.0–100.0)
Monocytes Absolute: 0.4 10*3/uL (ref 0.1–1.0)
Monocytes Relative: 8 %
Neutro Abs: 2 10*3/uL (ref 1.7–7.7)
Neutrophils Relative %: 39 %
Platelets: 130 10*3/uL — ABNORMAL LOW (ref 150–400)
RBC: 3.95 MIL/uL (ref 3.87–5.11)
RDW: 13 % (ref 11.5–15.5)
WBC: 5 10*3/uL (ref 4.0–10.5)

## 2014-12-26 LAB — ETHANOL: Alcohol, Ethyl (B): <5 mg/dL (ref <5)

## 2014-12-26 LAB — ACETAMINOPHEN LEVEL

## 2014-12-26 LAB — RAPID URINE DRUG SCREEN, HOSP PERFORMED
AMPHETAMINES: NOT DETECTED
Barbiturates: NOT DETECTED
Benzodiazepines: NOT DETECTED
Cocaine: NOT DETECTED
Opiates: NOT DETECTED
TETRAHYDROCANNABINOL: NOT DETECTED

## 2014-12-26 LAB — COMPREHENSIVE METABOLIC PANEL
ALT: 24 U/L (ref 14–54)
ANION GAP: 6 (ref 5–15)
AST: 35 U/L (ref 15–41)
Albumin: 4.3 g/dL (ref 3.5–5.0)
Alkaline Phosphatase: 116 U/L (ref 38–126)
BUN: 20 mg/dL (ref 6–20)
CHLORIDE: 106 mmol/L (ref 101–111)
CO2: 31 mmol/L (ref 22–32)
Calcium: 9.4 mg/dL (ref 8.9–10.3)
Creatinine, Ser: 0.88 mg/dL (ref 0.44–1.00)
GFR calc non Af Amer: 58 mL/min — ABNORMAL LOW (ref >60)
Glucose, Bld: 99 mg/dL (ref 65–99)
Potassium: 4.3 mmol/L (ref 3.5–5.1)
SODIUM: 143 mmol/L (ref 135–145)
Total Bilirubin: 0.6 mg/dL (ref 0.3–1.2)
Total Protein: 7.2 g/dL (ref 6.5–8.1)

## 2014-12-26 LAB — URINE MICROSCOPIC-ADD ON

## 2014-12-26 MED ORDER — BUSPIRONE HCL 10 MG PO TABS: 5.0000 mg | ORAL_TABLET | Freq: Every day | ORAL | Status: DC

## 2014-12-26 MED ORDER — CLOPIDOGREL BISULFATE 75 MG PO TABS
75.0000 mg | ORAL_TABLET | Freq: Every day | ORAL | Status: DC
  Administered 2014-12-26: 75 mg via ORAL
  Filled 2014-12-26: qty 1

## 2014-12-26 MED ORDER — MIRTAZAPINE 30 MG PO TABS: 45.0000 mg | ORAL_TABLET | Freq: Every day | ORAL | Status: DC

## 2014-12-26 MED ORDER — LISINOPRIL 20 MG PO TABS
20.0000 mg | ORAL_TABLET | Freq: Every day | ORAL | Status: DC
  Administered 2014-12-26: 20 mg via ORAL
  Filled 2014-12-26: qty 1

## 2014-12-26 NOTE — BH Assessment (Signed)
Assessment completed.  Consulted with Claudette Headonrad Withrow, DNP who recommends inpatient treatment at a [facility] facility for psychiatric hospitalization.  Davina PokeJoVea Railey Glad, LCSW Therapeutic Triage Specialist [facility] 12/26/2014 12:06 PM

## 2014-12-26 NOTE — BHH Counselor (Signed)
Patient daughter/POA Kaitlyn RaddleMaxine [name] came into ED to provide signature for patient to sign in to [facility]. Patient POA asked if patient can have "additional belongings" and was informed that we would ask. No questions or concerns. Patient paperwork faxed to [facility].   Davina PokeJoVea Janki Dike, LCSW Therapeutic Triage Specialist [facility] 12/26/2014 5:58 PM

## 2014-12-26 NOTE — ED Provider Notes (Signed)
  Physical Exam  BP 152/73 mmHg  Pulse 77  Temp(Src) 98.2 F (36.8 C) (Oral)  Resp 16  SpO2 95%  Physical Exam  ED Course  Procedures  MDM [facility] by Dr. Yetta BarreJones. Patient will be reevaluated before transfer.      Benjiman CoreNathan Cheral Cappucci, MD 12/26/14 [phone]

## 2014-12-26 NOTE — ED Notes (Signed)
Per Deirdre PeerHolly S and Plymouthodd C unsuccessful blood draw attempts; Darl PikesSusan T reports will attempt IV assisted with US.

## 2014-12-26 NOTE — BH Assessment (Addendum)
Assessment Note  Kaitlyn [name] is an 81 y.o. female who [facility] voluntarily for evaluation after patient was "unresponsive" to son-in-law. Patient states that she has current SI with no intent or plan. Patient participated minimally in the assessment and when answered some questions she would just stare forward. Patient is oriented to name and birthday. Patient continued to provide her birthday when asked about today's date. Patient refused to answer other questions related to orientation. Patient states that she has thoughts about killing herself but states that she has no plan. Patient denies previous suicide attempt, however, her daughter states that 15 years ago when the patients husband passed away the patient did attempt suicide by overdose and "cutting herself." She states that patient was admitted to "mental health" for stabilizations.  Patient denies HI and AVH and does not appear to be responding to internal stimuli.  Patient states that she does not know why she is in the hospital at this time. Patient denies use of drugs and alcohol.   Patients daughter Loura Halt was assessed indivdually and states that the patient has been increasingly depressed since her father passed away in 05/19/99. Patients daughter states that the patient has lived with her since that time. She states that the patient has shown more signs of depression over the past four years and has been prescribed Remeron at night "for her depression." Patients daughter states that over the past year the patient has stopped caring for herself and does not want to leave the bed. She states that the patient "barely eats" and interacts with others. Patients daughter states that the patient has called and cancelled appointments that she has made due to not wanting to leave the house. Patients daughter states that the patient does not respond when spoken to at times and earlier today she was "unresponsive" and EMS was called  for an evaluation. Patients daughter states that this has been getting worse over the past month. Patients daughter states that she feels that the patient has "given up." Patients daughter states that she distributes the patients medications and the patient does not have access to any weapons or firearms. Patients daughter states that she has not noticed any significant decline int he patients memory. Patients daughter states that the patient walks with a cane and can use the bathroom on her own but needs assistance bathing herself.   Consulted with Claudette Head, DNP who recommends inpatient geriatric psychiatric treatment at this this time.  TTS to seek placement.   Diagnosis: Major Depressive Disorder, Recurrent, Moderate  Past Medical History:  Past Medical History  Diagnosis Date  . Stroke (HCC)   . Hypertension     Past Surgical History  Procedure Laterality Date  . Vascular surgery    . Tubal ligation      Family History: History reviewed. No pertinent family history.  Social History:  reports that she has quit smoking. She does not have any smokeless tobacco history on file. She reports that she does not drink alcohol or use illicit drugs.  Additional Social History:  Alcohol / Drug Use Pain Medications: See PTA Prescriptions: See PTA Over the Counter: See PTA History of alcohol / drug use?: No history of alcohol / drug abuse  CIWA: CIWA-Ar BP: (!) 172/138 mmHg Pulse Rate: 66 COWS:    Allergies:  Allergies  Allergen Reactions  . Other     grispeg  - caused shaking    Home Medications:  (Not in a hospital admission)  OB/GYN Status:  No LMP recorded. Patient is postmenopausal.  General Assessment Data [facility] of [facility] TTS Assessment: In system Is this a Tele or Face-to-Face Assessment?: Face-to-Face Is this an Initial Assessment or a Re-assessment for this encounter?: Initial Assessment Marital status: Single Maiden name: Wallace Cullens Is patient  pregnant?: No Pregnancy Status: No Living Arrangements: Children (Daughter) Can pt return to current living arrangement?: Yes Admission Status: Voluntary Is patient capable of signing voluntary admission?: Yes Referral Source: Self/Family/Friend Insurance type: Medicare     Crisis Care Plan Living Arrangements: Children (Daughter) Name of Psychiatrist: None Name of Therapist: None  Education Status Is patient currently in school?: No Highest grade of school patient has completed: 12  Risk to self with the past 6 months Suicidal Ideation: No Has patient been a risk to self within the past 6 months prior to admission? : No Suicidal Intent: No Has patient had any suicidal intent within the past 6 months prior to admission? : No Is patient at risk for suicide?: No Suicidal Plan?: No Has patient had any suicidal plan within the past 6 months prior to admission? : No Access to Means: No What has been your use of drugs/alcohol within the last 12 months?: Denies Previous Attempts/Gestures:  ("I don't think so") Other Self Harm Risks: Denies Intentional Self Injurious Behavior: None Family Suicide History: Unknown Persecutory voices/beliefs?: No Depression: Yes Depression Symptoms: Loss of interest in usual pleasures, Feeling worthless/self pity Substance abuse history and/or treatment for substance abuse?: No Suicide prevention information given to non-admitted patients: Not applicable  Risk to Others within the past 6 months Homicidal Ideation: No Does patient have any lifetime risk of violence toward others beyond the six months prior to admission? : No Thoughts of Harm to Others: No Current Homicidal Intent: No Current Homicidal Plan: No Access to Homicidal Means: No Identified Victim: Denies History of harm to others?: No Assessment of Violence: None Noted Violent Behavior Description: Denies Does patient have access to weapons?: No Criminal Charges Pending?: No Does  patient have a court date: No Is patient on probation?: No  Psychosis Hallucinations: None noted Delusions: None noted  Mental Status Report Appearance/Hygiene: In hospital gown Eye Contact: Poor Motor Activity: Unable to assess Speech: Logical/coherent, Soft Level of Consciousness: Alert Mood: Depressed Affect: Depressed Anxiety Level: None Thought Processes: Coherent Judgement: Unimpaired Orientation: Person, Place Obsessive Compulsive Thoughts/Behaviors: None  Cognitive Functioning Concentration: Decreased Memory: Unable to Assess IQ: Average Insight: Unable to Assess Impulse Control: Good Appetite: Fair Vegetative Symptoms: None  ADLScreening [facility] Assessment Services) Patient's cognitive ability adequate to safely complete daily activities?: Yes  Prior Inpatient Therapy Prior Inpatient Therapy: Yes Prior Therapy Dates: 2001 Prior Therapy Facilty/Provider(s): "mental health" Reason for Treatment: Suicide attempt  Prior Outpatient Therapy Prior Outpatient Therapy: No Prior Therapy Dates: N/A Prior Therapy Facilty/Provider(s): N/A Reason for Treatment: N/A Does patient have an ACCT team?: No Does patient have Intensive In-House Services?  : No Does patient have [facility] services? : No Does patient have P4CC services?: No  ADL Screening (condition at time of admission) Patient's cognitive ability adequate to safely complete daily activities?: Yes Is the patient deaf or have difficulty hearing?: Yes Does the patient have difficulty seeing, even when wearing glasses/contacts?: No  Home Assistive Devices/Equipment Home Assistive Devices/Equipment: Cane (specify quad or straight)    Abuse/Neglect Assessment (Assessment to be complete while patient is alone) Physical Abuse: Denies Verbal Abuse: Denies Sexual Abuse: Denies Exploitation of patient/patient's resources: Denies Possible abuse reported to::  [place] Social Work Values / Beliefs Cultural  Requests During Hospitalization: None Spiritual Requests During Hospitalization: None Consults Spiritual Care Consult Needed: No Social Work Consult Needed: No Merchant navy officerAdvance Directives (For Healthcare) Does patient have an advance directive?: No, Yes Type of Advance Directive: MidwifeHealthcare Power of Attorney Copy of advanced directive(s) in chart?: No - copy requested    Additional Information 1:1 In Past 12 Months?: No CIRT Risk: No Elopement Risk: No Does patient have medical clearance?: Yes     Disposition:  Disposition Initial Assessment Completed for this Encounter: Yes Disposition of Patient: Inpatient treatment program Type of inpatient treatment program: Adult  On Site Evaluation by:   Reviewed with Physician:    Tagan Bartram 12/26/2014 12:28 PM

## 2014-12-26 NOTE — Progress Notes (Signed)
   12/26/14 0000  CM Assessment  Expected Discharge Plan [facility]iatric Hospital  In-house Referral Clinical Social Work  Discharge Planning Services CM Consult  [facility]  Choice offered to / list presented to  Adult Children  Discharge Disposition Psychiatric Hospital

## 2014-12-26 NOTE — ED Notes (Signed)
Attempted to draw labs x 1 unsuccessful.

## 2014-12-26 NOTE — ED Notes (Signed)
Belongings placed in LOCKER 32 as documented 12/9 1000.

## 2014-12-26 NOTE — BHH Counselor (Signed)
Received voluntary consent to [facility] with request that patient daughter sign and fax back to them to admit patient.  Contacted patients daughter at [phone] to request that she come to [facility] to sign paperwork and [facility]. Patients daughter, Presley Raddlemaxine Brennerman states that she will come into [facility] to sign paperwork as requested. Informed patients daughter to request TTS so that paperwork can be signed and faxed.    Davina PokeJoVea Ande Therrell, LCSW Therapeutic Triage Specialist [facility] 12/26/2014 5:00 PM

## 2014-12-26 NOTE — BH Assessment (Signed)
BHH Assessment Progress Note  At the request of Selena BattenKim, the nurse case manager, this writer spoke to pt and her family about treatment plan.  Pt's daughter, Mena PaulsMaxine Bennerman [phone]) and son-in-law, Bernell ListSterlin Bennerman [phone]) were present at the ED.  The pt signed Consent to Release Information to both of them, and it was subsequently found that they hold health care power of attorney for the pt.  A copy of this was made and placed in the pt's chart.  They were informed that, per Thedore MinsMojeed Akintayo, MD's recommendation, placement is being sought for her in a geriatric psychiatry facility.  All are in agreement with this plan at this time.  Doylene Canninghomas Elzia Hott, MA Triage Specialist [phone]

## 2014-12-26 NOTE — ED Notes (Signed)
Report given to Scar[facility]ances [facility]

## 2014-12-26 NOTE — ED Notes (Signed)
Toiletting offered; patient states she will let nurse know when she has to go

## 2014-12-26 NOTE — ED Notes (Signed)
Bed: WA21 Expected date:  Expected time:  Means of arrival:  Comments: Ems-86 f si

## 2014-12-26 NOTE — ED Notes (Signed)
Pt belongings placed in belongings bag; belongings included knit hat, yellow sweater, bra, black pants, and white socks.

## 2014-12-26 NOTE — ED Provider Notes (Signed)
CSN: [id number]     Arrival date & time 12/26/14  0912 History   First MD Initiated Contact with Patient 12/26/14 [phone]     Chief Complaint  Patient presents with  . Suicidal     (Consider location/radiation/quality/duration/timing/severity/associated sxs/prior Treatment) HPI Comments: 81yo female with history of stroke and htn presents with family for concern for depression and suicidal ideation. Pt denies having a plan, however reports having frequent thoughts. Family states she has had slow decline since her husband's death, does not want to do anything. Just lays in bed. Sometimes goes to activity center but does not participate in activities. No new changes in medications, no acute changes in mentation, no fevers/chills/infectious symptoms, no falls.  Several years of decline.  Pt reports depression, suicidal ideation.    Past Medical History  Diagnosis Date  . Stroke (HCC)   . Hypertension    Past Surgical History  Procedure Laterality Date  . Vascular surgery    . Tubal ligation     History reviewed. No pertinent family history. Social History  Substance Use Topics  . Smoking status: Former Games developer  . Smokeless tobacco: None  . Alcohol Use: No   OB History    No data available     Review of Systems  Constitutional: Negative for fever.  HENT: Negative for sore throat.   Eyes: Negative for visual disturbance.  Respiratory: Negative for cough and shortness of breath.   Cardiovascular: Negative for chest pain.  Gastrointestinal: Negative for nausea, vomiting and abdominal pain.  Genitourinary: Negative for difficulty urinating.  Musculoskeletal: Negative for back pain and neck pain.  Skin: Negative for rash.  Neurological: Negative for syncope and headaches.  Psychiatric/Behavioral: Positive for suicidal ideas, behavioral problems and dysphoric mood. Negative for hallucinations and confusion.      Allergies  Other  Home Medications   Prior to Admission  medications   Medication Sig Start Date End Date Taking? Authorizing Provider  busPIRone (BUSPAR) 5 MG tablet Take 5 mg by mouth daily.  11/17/14  Yes Historical Provider, MD  clobetasol ointment (TEMOVATE) 0.05 % Apply 1 application topically 2 (two) times daily. Apply to affected area.   Yes Historical Provider, MD  clopidogrel (PLAVIX) 75 MG tablet Take 75 mg by mouth daily. 10/11/14  Yes Historical Provider, MD  ergocalciferol (VITAMIN D2) 50000 UNITS capsule Take 50,000 Units by mouth once a week. Takes on Mondays   Yes Historical Provider, MD  lisinopril (PRINIVIL,ZESTRIL) 20 MG tablet Take 20 mg by mouth daily.   Yes Historical Provider, MD  mirtazapine (REMERON) 45 MG tablet Take 45 mg by mouth at bedtime. 11/17/14  Yes Historical Provider, MD   BP 172/79 mmHg  Pulse 79  Temp(Src) 98.6 F (37 C) (Oral)  Resp 18  SpO2 96% Physical Exam  Constitutional: She is oriented to person, place, and time. She appears well-developed and well-nourished. No distress.  HENT:  Head: Normocephalic and atraumatic.  Eyes: Conjunctivae and EOM are normal.  Neck: Normal range of motion.  Cardiovascular: Normal rate, regular rhythm, normal heart sounds and intact distal pulses.  Exam reveals no gallop and no friction rub.   No murmur heard. Pulmonary/Chest: Effort normal and breath sounds normal. No respiratory distress. She has no wheezes. She has no rales.  Abdominal: Soft. She exhibits no distension. There is no tenderness. There is no guarding.  Musculoskeletal: She exhibits no edema or tenderness.  Neurological: She is alert and oriented to person, place, and time.  Skin: Skin is  warm and dry. No rash noted. She is not diaphoretic. No erythema.  Nursing note and vitals reviewed.   ED Course  Procedures (including critical care time) Labs Review Labs Reviewed  COMPREHENSIVE METABOLIC PANEL - Abnormal; Notable for the following:    GFR calc non Af Amer 58 (*)    All other components within  normal limits  CBC WITH DIFFERENTIAL/PLATELET - Abnormal; Notable for the following:    Platelets 130 (*)    All other components within normal limits  URINALYSIS, ROUTINE W REFLEX MICROSCOPIC (NOT AT [facility]) - Abnormal; Notable for the following:    Leukocytes, UA SMALL (*)    All other components within normal limits  ACETAMINOPHEN LEVEL - Abnormal; Notable for the following:    Acetaminophen (Tylenol), Serum <10 (*)    All other components within normal limits  URINE MICROSCOPIC-ADD ON - Abnormal; Notable for the following:    Squamous Epithelial / LPF 0-5 (*)    Bacteria, UA RARE (*)    All other components within normal limits  ETHANOL  URINE RAPID DRUG SCREEN, HOSP PERFORMED    Imaging Review Dg Chest 2 View  12/26/2014  CLINICAL DATA:  Cough.  Hypertension. EXAM: CHEST  2 VIEW COMPARISON:  September 13, 2006 FINDINGS: There is no edema or consolidation. Heart is upper normal in size with pulmonary vascularity within normal limits. No adenopathy. There is atherosclerotic calcification throughout the aorta. There are surgical clips in the right upper quadrant of the abdomen. IMPRESSION: No edema or consolidation. Extensive atherosclerotic calcification in the aorta. Electronically Signed   By: Bretta BangWilliam  Woodruff III M.D.   On: 12/26/2014 13:37   I have personally reviewed and evaluated these images and lab results as part of my medical decision-making.   EKG Interpretation   Date/Time:  Friday December 26 2014 10:12:03 EST Ventricular Rate:  70 PR Interval:  141 QRS Duration: 83 QT Interval:  405 QTC Calculation: 437 R Axis:   16 Text Interpretation:  Sinus rhythm ED PHYSICIAN INTERPRETATION AVAILABLE  IN [facility] Confirmed by TEST, Record ([id number]) on 12/27/2014 9:20:36  AM      MDM   Final diagnoses:  Recurrent major depressive disorder, remission status unspecified (HCC)   81yo female with history of stroke and htn presents with family for concern for depression  and suicidal ideation. Pt denies having a plan, however reports having frequent thoughts. Family states she has had slow decline since her husband's death, does not want to do anything.  Labs WNL. No acute changes in mentation, no new neurologic changes, no infectious symptoms and given hx of slow decline and suicidal ideation doubt acute medical pathology.  TTS consulted and will admit pt for geriatric inpt psychiatric care.     Alvira MondayErin Declynn Lopresti, MD 12/27/14 1104
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
## 2014-12-26 NOTE — Progress Notes (Signed)
ED CM consulted by TTS staff Jovea to see if pt could benefit from home health, adult day care services CM spoke with pt, pt's daughter and son in law Pt noted to be eating Pt included in assessment but continued to eat, looked at Cm and did not answer any questions All answers and questions came from daughter and son in law who denied need for home health, adult care services, PACE or ACE and inquired when pt would be seen and given resources for her mental health Reports when pt had a "breakdown once before [facility] presently is not active with [facility] Cm discussed medical clearance from EDP and then assessment with [facility] so they could understand the [facility]  CM spoke with [facility] TTS staff who spoke with daughter and son in law to update them on plan of care

## 2014-12-26 NOTE — ED Notes (Signed)
Belongings removed from locker 32 & [facility]

## 2014-12-26 NOTE — ED Notes (Signed)
Pt placed in paper scrubs prior to transport by [facility].

## 2014-12-26 NOTE — BHH Counselor (Addendum)
Patient has been accepted to to [facility] by Dr. Yetta BarreJones. Call [phone] for [place] to [facility].   Davina PokeJoVea Trevante Tennell, LCSW Therapeutic Triage Specialist [facility] 12/26/2014 6:56 PM

## 2016-01-06 ENCOUNTER — Encounter (HOSPITAL_COMMUNITY): Payer: Self-pay

## 2016-01-06 ENCOUNTER — Emergency Department (HOSPITAL_COMMUNITY)
Admission: EM | Admit: 2016-01-06 | Discharge: 2016-01-07 | Disposition: A | Discharge status: Home / Self Care | Payer: Medicare Other | Attending: Emergency Medicine | Admitting: Emergency Medicine

## 2016-01-06 DIAGNOSIS — Z87891 Personal history of nicotine dependence: Secondary | ICD-10-CM | POA: Diagnosis not present

## 2016-01-06 DIAGNOSIS — I10 Essential (primary) hypertension: Secondary | ICD-10-CM | POA: Insufficient documentation

## 2016-01-06 DIAGNOSIS — Z79899 Other long term (current) drug therapy: Secondary | ICD-10-CM | POA: Insufficient documentation

## 2016-01-06 DIAGNOSIS — R791 Abnormal coagulation profile: Secondary | ICD-10-CM | POA: Insufficient documentation

## 2016-01-06 DIAGNOSIS — Z8673 Personal history of transient ischemic attack (TIA), and cerebral infarction without residual deficits: Secondary | ICD-10-CM | POA: Insufficient documentation

## 2016-01-06 DIAGNOSIS — H81399 Other peripheral vertigo, unspecified ear: Secondary | ICD-10-CM | POA: Insufficient documentation

## 2016-01-06 DIAGNOSIS — Z7902 Long term (current) use of antithrombotics/antiplatelets: Secondary | ICD-10-CM | POA: Diagnosis not present

## 2016-01-06 DIAGNOSIS — R42 Dizziness and giddiness: Secondary | ICD-10-CM | POA: Diagnosis present

## 2016-01-06 MED ORDER — MECLIZINE HCL 25 MG PO TABS
25.0000 mg | ORAL_TABLET | Freq: Once | ORAL | Status: AC
  Administered 2016-01-07: 25 mg via ORAL
  Filled 2016-01-06: qty 1

## 2016-01-06 NOTE — ED Provider Notes (Signed)
By signing my name below, I, Bridgette HabermannMaria Tan, attest that this documentation has been prepared under the direction and in the presence of Kaisey Huseby N Journee Bobrowski, DO. Electronically Signed: Bridgette HabermannMaria Tan, ED Scribe. 01/06/16. 11:46 PM.  TIME SEEN: 11:21 PM  CHIEF COMPLAINT:  Chief Complaint  Patient presents with  . Dizziness    went to walk to bathroom felt dizzy and light headed    HPI:  Kaitlyn [name] is a 82 y.o. female with h/o HTN and CVA who presents to the Emergency Department by EMS complaining of intermittent dizziness onset ~10 pm tonight with associated shortness of breath. Pt states she was walking into the bathroom at the onset of her symptoms. She reports h/o CVA and notes her dizziness at this time feels somewhat similar to her CVA symptoms. Describes the dizziness as the room is spinning. Denies lightheadedness. Granddaughter at bedside reports that pt's speech at this time is at baseline and denies any speech change. Has a mild right-sided facial droop which granddaughter is not sure if this is new or old. She has chronic left upper and lower extremity weakness which is unchanged. Normally ambulates with a walker but was not using her walker when she went to the bathroom. Pt denies chest pain, headache, numbness, paresthesias, fever, or any other associated symptoms. Is not on aspirin or Plavix anymore.   ROS: See HPI Constitutional: no fever  Eyes: no drainage  ENT: no runny nose   Cardiovascular:  no chest pain  Resp: SOB  GI: no vomiting GU: no dysuria Integumentary: no rash  Allergy: no hives  Musculoskeletal: no leg swelling  Neurological: no slurred speech ROS otherwise negative  PAST MEDICAL HISTORY/PAST SURGICAL HISTORY:  Past Medical History:  Diagnosis Date  . Hypertension   . Stroke [facility](HCC)     MEDICATIONS:  Prior to Admission medications   Medication Sig Start Date End Date Taking? Authorizing Provider  busPIRone (BUSPAR) 5 MG tablet Take 5 mg by mouth daily.   11/17/14   Historical Provider, MD  clobetasol ointment (TEMOVATE) 0.05 % Apply 1 application topically 2 (two) times daily. Apply to affected area.    Historical Provider, MD  clopidogrel (PLAVIX) 75 MG tablet Take 75 mg by mouth daily. 10/11/14   Historical Provider, MD  ergocalciferol (VITAMIN D2) 50000 UNITS capsule Take 50,000 Units by mouth once a week. Takes on Mondays    Historical Provider, MD  lisinopril (PRINIVIL,ZESTRIL) 20 MG tablet Take 20 mg by mouth daily.    Historical Provider, MD  mirtazapine (REMERON) 45 MG tablet Take 45 mg by mouth at bedtime. 11/17/14   Historical Provider, MD    ALLERGIES:  Allergies  Allergen Reactions  . Other     grispeg  - caused shaking    SOCIAL HISTORY:  Social History  Substance Use Topics  . Smoking status: Former Games developermoker  . Smokeless tobacco: Never Used  . Alcohol use No    FAMILY HISTORY: No family history on file.  EXAM: BP 166/81 (BP Location: Right Arm)   Pulse 88   Temp 98.3 F (36.8 C) (Oral)   Resp 18   Ht 5' (1.524 m)   Wt 145 lb (65.8 kg)   SpO2 98%   BMI 28.32 kg/m  CONSTITUTIONAL: Alert and oriented and responds appropriately to questions. Elderly; chronically ill-appearing. HEAD: Normocephalic EYES: Conjunctivae clear, PERRL, EOMI ENT: normal nose; no rhinorrhea; moist mucous membranes NECK: Supple, no meningismus, no nuchal rigidity, no LAD  CARD: RRR; S1 and S2  appreciated; no murmurs, no clicks, no rubs, no gallops RESP: Normal chest excursion without splinting or tachypnea; breath sounds clear and equal bilaterally; no wheezes, no rhonchi, no rales, no hypoxia or respiratory distress, speaking full sentences ABD/GI: Normal bowel sounds; non-distended; soft, non-tender, no rebound, no guarding, no peritoneal signs, no hepatosplenomegaly BACK:  The back appears normal and is non-tender to palpation, there is no CVA tenderness EXT: Normal ROM in all joints; non-tender to palpation; no edema; normal capillary  refill; no cyanosis, no calf tenderness or swelling    SKIN: Normal color for age and race; warm; no rash NEURO: Moves all extremities equally, sensation to light touch intact diffusely, mild right-sided facial droopWhich family is not sure if this is chronic or not, cranial nerves II through XII intact,  no asphasia but has mild chronic dysarthria that is unchanged per family, chronic unchanged weakness in LUE and LLE, dysmetria to finger to nose testing bilaterally, worse in the LUE which may be secondary to her chronic weakness PSYCH: The patient's mood and manner are appropriate. Grooming and personal hygiene are appropriate.  MEDICAL DECISION MAKING: Patient here with vertigo. Symptoms started at 10 PM. She does have some dysmetria to finger-nose testing bilaterally worse with the left approximately which may be secondary to her chronic weakness.  Her symptoms are improving. Therefore she would not be a TPA candidate and her NIH stroke scale is too low as it is only 1 secondary to some ataxia but again this may be secondary to her chronic left upper extremity weakness. Denies any new weakness. She does have some dysarthria on exam which granddaughter reports is chronic. Also some very minimal right-sided facial droop which they are not sure if this is new or not. Discussed with Dr. Roseanne RenoStewart on call for neurology. He agrees that she would not be a TPA candidate and agrees that we can obtain an MRI of her brain without contrast. We'll give small dose of meclizine and IV fluids for symptomatically relief. Labs pending. Also complained of some mild shortness of breath earlier but no chest pain or chest discomfort. EKG shows no new ischemic changes. Will obtain chest x-ray.  ED PROGRESS: 1:20 AM  Pt's labs are unremarkable. Chest x-ray clear. Urinalysis and MRI of her brain pending. Patient hemodynamically stable.  2:15 AM  Pt's MRI shows no acute abnormalities. Urine still pending. We will ambulate patient  in the ED.  2:45 AM  Pt reports feeling much better. Able to ambulate with a walker without any difficulty. Steady gait. Urinalysis pending.  3:30 AM  Pt's urine shows moderate leukocytes but no bacteria. We'll send urine culture but hold on Biaxin this time. I feel she is safe to be discharged with meclizine, Zofran to take at home as needed. She has outpatient PCP. Discussed with patient and granddaughter and they are comfortable with this plan. Denies a shortness of breath at this time. Hemodynamically stable. At her neurologic baseline.  Have advised her to use her walker at all times.   At this time, I do not feel there is any life-threatening condition present. I have reviewed and discussed all results (EKG, imaging, lab, urine as appropriate) and exam findings with patient/family. I have reviewed nursing notes and appropriate previous records.  I feel the patient is safe to be discharged home without further emergent workup and can continue workup as an outpatient as needed. Discussed usual and customary return precautions. Patient/family verbalize understanding and are comfortable with this plan.  Outpatient  follow-up has been provided. All questions have been answered.    EKG Interpretation  Date/Time:  Wednesday January 06 2016 23:55:14 EST Ventricular Rate:  82 PR Interval:    QRS Duration: 80 QT Interval:  369 QTC Calculation: 431 R Axis:   9 Text Interpretation:  Sinus rhythm Probable left atrial enlargement Probable left ventricular hypertrophy No significant change since last tracing Confirmed by Benay Pomeroy,  DO, [facility] [place] ([id number]) on 01/07/2016 12:28:09 AM         I personally performed the services described in this documentation, which was scribed in my presence. The recorded information has been reviewed and is accurate.     Kaitlyn Maw Kathleen Tamm, DO 01/07/16 [phone]

## 2016-01-06 NOTE — ED Triage Notes (Signed)
207/86 pt went to walk in to the bathroom when she felt dizzy and light headed pt was Hypertensive as per EMS no neuro deficits noted per EMS

## 2016-01-07 ENCOUNTER — Emergency Department (HOSPITAL_COMMUNITY): Payer: Medicare Other

## 2016-01-07 ENCOUNTER — Encounter (HOSPITAL_COMMUNITY): Payer: Self-pay | Admitting: *Deleted

## 2016-01-07 ENCOUNTER — Emergency Department (HOSPITAL_COMMUNITY)
Admission: EM | Admit: 2016-01-07 | Discharge: 2016-01-07 | Disposition: A | Discharge status: Home / Self Care | Payer: Medicare Other | Source: Home / Self Care | Attending: Emergency Medicine | Admitting: Emergency Medicine

## 2016-01-07 DIAGNOSIS — Y999 Unspecified external cause status: Secondary | ICD-10-CM | POA: Insufficient documentation

## 2016-01-07 DIAGNOSIS — Z79899 Other long term (current) drug therapy: Secondary | ICD-10-CM

## 2016-01-07 DIAGNOSIS — W010XXA Fall on same level from slipping, tripping and stumbling without subsequent striking against object, initial encounter: Secondary | ICD-10-CM

## 2016-01-07 DIAGNOSIS — Y939 Activity, unspecified: Secondary | ICD-10-CM | POA: Insufficient documentation

## 2016-01-07 DIAGNOSIS — M545 Low back pain: Secondary | ICD-10-CM

## 2016-01-07 DIAGNOSIS — Z8673 Personal history of transient ischemic attack (TIA), and cerebral infarction without residual deficits: Secondary | ICD-10-CM

## 2016-01-07 DIAGNOSIS — W19XXXA Unspecified fall, initial encounter: Secondary | ICD-10-CM

## 2016-01-07 DIAGNOSIS — I10 Essential (primary) hypertension: Secondary | ICD-10-CM | POA: Insufficient documentation

## 2016-01-07 DIAGNOSIS — Z87891 Personal history of nicotine dependence: Secondary | ICD-10-CM

## 2016-01-07 DIAGNOSIS — Y929 Unspecified place or not applicable: Secondary | ICD-10-CM

## 2016-01-07 DIAGNOSIS — H81399 Other peripheral vertigo, unspecified ear: Secondary | ICD-10-CM | POA: Diagnosis not present

## 2016-01-07 LAB — URINALYSIS, ROUTINE W REFLEX MICROSCOPIC
BILIRUBIN URINE: NEGATIVE
Bacteria, UA: NONE SEEN
GLUCOSE, UA: NEGATIVE mg/dL
Hgb urine dipstick: NEGATIVE
Ketones, ur: NEGATIVE mg/dL
NITRITE: NEGATIVE
PH: 5 (ref 5.0–8.0)
Protein, ur: NEGATIVE mg/dL
SPECIFIC GRAVITY, URINE: 1.018 (ref 1.005–1.030)

## 2016-01-07 LAB — CBC
HEMATOCRIT: 37.9 % (ref 36.0–46.0)
Hemoglobin: 12.3 g/dL (ref 12.0–15.0)
MCH: 30.7 pg (ref 26.0–34.0)
MCHC: 32.5 g/dL (ref 30.0–36.0)
MCV: 94.5 fL (ref 78.0–100.0)
PLATELETS: 128 10*3/uL — AB (ref 150–400)
RBC: 4.01 MIL/uL (ref 3.87–5.11)
RDW: 13.4 % (ref 11.5–15.5)
WBC: 6.5 10*3/uL (ref 4.0–10.5)

## 2016-01-07 LAB — DIFFERENTIAL
BASOS PCT: 0 %
Basophils Absolute: 0 10*3/uL (ref 0.0–0.1)
EOS ABS: 0.1 10*3/uL (ref 0.0–0.7)
Eosinophils Relative: 1 %
Lymphocytes Relative: 32 %
Lymphs Abs: 2.1 10*3/uL (ref 0.7–4.0)
MONO ABS: 0.4 10*3/uL (ref 0.1–1.0)
MONOS PCT: 6 %
Neutro Abs: 3.9 10*3/uL (ref 1.7–7.7)
Neutrophils Relative %: 61 %

## 2016-01-07 LAB — COMPREHENSIVE METABOLIC PANEL
ALT: 22 U/L (ref 14–54)
ANION GAP: 6 (ref 5–15)
AST: 31 U/L (ref 15–41)
Albumin: 3.9 g/dL (ref 3.5–5.0)
Alkaline Phosphatase: 108 U/L (ref 38–126)
BUN: 33 mg/dL — ABNORMAL HIGH (ref 6–20)
CHLORIDE: 104 mmol/L (ref 101–111)
CO2: 30 mmol/L (ref 22–32)
CREATININE: 1.03 mg/dL — AB (ref 0.44–1.00)
Calcium: 9.6 mg/dL (ref 8.9–10.3)
GFR, EST AFRICAN AMERICAN: 55 mL/min — AB (ref >60)
GFR, EST NON AFRICAN AMERICAN: 47 mL/min — AB (ref >60)
Glucose, Bld: 119 mg/dL — ABNORMAL HIGH (ref 65–99)
POTASSIUM: 4.5 mmol/L (ref 3.5–5.1)
SODIUM: 140 mmol/L (ref 135–145)
Total Bilirubin: 0.5 mg/dL (ref 0.3–1.2)
Total Protein: 6.8 g/dL (ref 6.5–8.1)

## 2016-01-07 LAB — I-STAT TROPONIN, ED: TROPONIN I, POC: 0.01 ng/mL (ref 0.00–0.08)

## 2016-01-07 LAB — APTT: APTT: 38 s — AB (ref 24–36)

## 2016-01-07 LAB — PROTIME-INR
INR: 1.12
PROTHROMBIN TIME: 14.4 s (ref 11.4–15.2)

## 2016-01-07 MED ORDER — MECLIZINE HCL 12.5 MG PO TABS: 12.5000 mg | ORAL_TABLET | Freq: Three times a day (TID) | ORAL | 0 refills | Status: AC | PRN

## 2016-01-07 MED ORDER — HYDROCODONE-ACETAMINOPHEN 5-325 MG PO TABS
1.0000 | ORAL_TABLET | Freq: Once | ORAL | Status: AC
  Administered 2016-01-07: 1 via ORAL
  Filled 2016-01-07: qty 1

## 2016-01-07 MED ORDER — ONDANSETRON 4 MG PO TBDP: 4.0000 mg | ORAL_TABLET | Freq: Three times a day (TID) | ORAL | 0 refills | Status: AC | PRN

## 2016-01-07 MED ORDER — SODIUM CHLORIDE 0.9 % IV BOLUS (SEPSIS)
500.0000 mL | Freq: Once | INTRAVENOUS | Status: AC
  Administered 2016-01-07: 500 mL via INTRAVENOUS

## 2016-01-07 NOTE — ED Notes (Signed)
The pt fell getting out of her care at home after leaving here for another reason.  She is c/o back pain now

## 2016-01-07 NOTE — ED Notes (Signed)
Pt ambulated with walker. No problems.  Pt was able to ambulate out in the hall.  EDP notified

## 2016-01-07 NOTE — ED Notes (Signed)
To ct

## 2016-01-07 NOTE — Discharge Instructions (Signed)
Your labs, urine, EKG, chest x-ray and MRI of your brain were normal today and showed no new stroke. This is likely peripheral vertigo coming from your in her ear. You may take meclizine as needed at home for dizziness.

## 2016-01-07 NOTE — ED Notes (Signed)
Patient ambulated with walker at baseline per family.

## 2016-01-07 NOTE — ED Provider Notes (Signed)
[facility]-EMERGENCY DEPT Provider Note   CSN: [id number] Arrival date & time: 01/07/16  0518     History   Chief Complaint Chief Complaint  Patient presents with  . Fall  . Back Pain    HPI Kaitlyn [name] is a 82 y.o. female.  Patient presents to the ED with a chief complaint of fall.  She states that she was seen in the hospital last night for dizziness and was worked up for stroke including having an MRI.  Her workup was reassuring and she was able to be discharged home.  When she arrived home and was getting out of the car, she fell backward landing on her back.  She complains of mid-low back pain.  She denies hitting her head or passing out.  Denies any neck pain, numbness, weakness, or tingling.  She has not attempted to ambulate since the fall, but is able to move all of her extremities.  She reports that the pain is moderate in severity, and is worsened with movement and palpation.   The history is provided by the patient. No language interpreter was used.    Past Medical History:  Diagnosis Date  . Hypertension   . Stroke [facility])     There are no active problems to display for this patient.   Past Surgical History:  Procedure Laterality Date  . TUBAL LIGATION    . VASCULAR SURGERY      OB History    No data available       Home Medications    Prior to Admission medications   Medication Sig Start Date End Date Taking? Authorizing Provider  busPIRone (BUSPAR) 5 MG tablet Take 5 mg by mouth daily.  11/17/14   Historical Provider, MD  ergocalciferol (VITAMIN D2) 50000 UNITS capsule Take 50,000 Units by mouth once a week. Takes on Mondays    Historical Provider, MD  meclizine (ANTIVERT) 12.5 MG tablet Take 1 tablet (12.5 mg total) by mouth 3 (three) times daily as needed for dizziness. 01/07/16   Kristen N Ward, DO  mirtazapine (REMERON) 45 MG tablet Take 45 mg by mouth at bedtime. 11/17/14   Historical Provider, MD  ondansetron (ZOFRAN ODT) 4 MG  disintegrating tablet Take 1 tablet (4 mg total) by mouth every 8 (eight) hours as needed for nausea or vomiting. 01/07/16   Layla Maw Ward, DO    Family History No family history on file.  Social History Social History  Substance Use Topics  . Smoking status: Former Games developer  . Smokeless tobacco: Never Used  . Alcohol use No     Allergies   Other   Review of Systems Review of Systems  Musculoskeletal: Positive for arthralgias and back pain.  All other systems reviewed and are negative.    Physical Exam Updated Vital Signs BP 186/66   Pulse 71   Temp 98.5 F (36.9 C) (Oral)   Ht 5' (1.524 m)   Wt 65.8 kg   SpO2 99%   BMI 28.32 kg/m   Physical Exam  Constitutional: She is oriented to person, place, and time. She appears well-developed and well-nourished.  HENT:  Head: Normocephalic and atraumatic.  Eyes: Conjunctivae and EOM are normal. Pupils are equal, round, and reactive to light.  Neck: Normal range of motion. Neck supple.  Cardiovascular: Normal rate and regular rhythm.  Exam reveals no gallop and no friction rub.   No murmur heard. Pulmonary/Chest: Effort normal and breath sounds normal. No respiratory distress. She has no wheezes.  She has no rales. She exhibits no tenderness.  Abdominal: Soft. Bowel sounds are normal. She exhibits no distension and no mass. There is no tenderness. There is no rebound and no guarding.  Musculoskeletal: Normal range of motion. She exhibits no edema or tenderness.  Lumbar spine tender to palpation, without obvious bony deformity or abnormality Moves all extremities  Neurological: She is alert and oriented to person, place, and time.  Skin: Skin is warm and dry.  Psychiatric: She has a normal mood and affect. Her behavior is normal. Judgment and thought content normal.  Nursing note and vitals reviewed.    ED Treatments / Results  Labs (all labs ordered are listed, but only abnormal results are displayed) Labs Reviewed -  No data to display  EKG  EKG Interpretation None       Radiology Dg Chest 2 View  Result Date: 01/07/2016 CLINICAL DATA:  82 year old female with sudden onset dizziness and shortness of breath. EXAM: CHEST  2 VIEW COMPARISON:  Chest radiograph dated 12/26/2014 FINDINGS: The lungs are clear. There is no pleural effusion or pneumothorax. The cardiac silhouette is within normal limits. No acute osseous pathology. IMPRESSION: No active cardiopulmonary disease. Electronically Signed   By: Elgie CollardArash  Radparvar M.D.   On: 01/07/2016 01:14   Mr Brain Wo Contrast  Result Date: 01/07/2016 CLINICAL DATA:  Initial evaluation for acute vertigo. EXAM: MRI HEAD WITHOUT CONTRAST TECHNIQUE: Multiplanar, multiecho pulse sequences of the brain and surrounding structures were obtained without intravenous contrast. COMPARISON:  Prior CT from 12/24/2009. FINDINGS: Brain: Diffuse prominence of the CSF containing spaces is compatible with generalized age-related cerebral atrophy. Patchy T2/FLAIR hyperintensity within the periventricular and deep white matter both cerebral hemispheres most compatible with chronic small vessel ischemic disease, moderate nature. Chronic microvascular ischemic changes present within the pons. Multiple remote bilateral cerebellar infarcts are present. There is a remote lacunar infarct involving the left lateral medulla. Remote lacunar infarcts within the left thalamus and right corona radiata. Remote cortical infarct within the right parietal lobe. No abnormal foci of restricted diffusion to suggest acute or subacute ischemia. Gray-white matter differentiation maintained. No acute or chronic intracranial hemorrhage. No mass lesion, midline shift, or mass effect. Ventricular prominence related to global parenchymal volume loss without hydrocephalus. No extra-axial fluid collection. Major dural sinuses are grossly patent. Incidental note made of a partially empty sella. Vascular: Major intracranial  vascular flow voids are maintained. Skull and upper cervical spine: Craniocervical junction within normal limits. Mild multilevel degenerative spondylolysis noted within the visualized upper cervical spine without significant stenosis. Bone marrow signal intensity within normal limits. No scalp soft tissue abnormality. Sinuses/Orbits: The globes and orbital soft tissues within normal limits. Patient is status post cataract extraction bilaterally. Paranasal sinuses are clear. No mastoid effusion. Inner ear structures grossly normal. IMPRESSION: 1. No acute intracranial process identified. 2. Generalized age-related cerebral atrophy with moderate chronic microvascular ischemic disease and multiple remote infarcts as above. Electronically Signed   By: Rise MuBenjamin  McClintock M.D.   On: 01/07/2016 01:57    Procedures Procedures (including critical care time)  Medications Ordered in ED Medications - No data to display   Initial Impression / Assessment and Plan / ED Course  I have reviewed the triage vital signs and the nursing notes.  Pertinent labs & imaging results that were available during my care of the patient were reviewed by me and considered in my medical decision making (see chart for details).  Clinical Course     Patient with fall  after exiting her vehicle this morning; seems to have been mechanical.  Seen last night for dizziness.  Had negative MRI.  Discussed with Dr. Elesa MassedWard, who saw patient earlier in the evening.  Dizziness likely peripheral given no acute process on MRI.  Patient ambulated with walker in the ED prior to discharge.  I believe the fall was from transferring out of the car this morning, and therefore mechanical.  Doubt a need for further workup, except for CT of lumbar spine to look for fracture.  If negative, plan to try ambulating the patient and discharge.  8:55 AM CT negative.  Patient ambulates in ED with walker at baseline.  Discharge to home with PCP  follow-up.  Final Clinical Impressions(s) / ED Diagnoses   Final diagnoses:  Fall, initial encounter    New Prescriptions New Prescriptions   No medications on file     Roxy HorsemanRobert Jahniah Pallas, [place] JerseyPA-C 01/07/16 [phone]

## 2016-01-07 NOTE — ED Notes (Signed)
Patient transported to X-ray 

## 2016-01-07 NOTE — ED Notes (Signed)
Pt had a sudden onset of dizziness while in the bathroom.  Pt was found to be hypertensive and dizzy also.  Pt is alert and oriented at this time.  Await MRI. Attempt IV placement x2 in right arm without success, will check with second RN for IV access

## 2016-01-07 NOTE — ED Provider Notes (Signed)
Medical screening examination/treatment/procedure(s) were conducted as a shared visit with non-physician practitioner(s) and myself.  I personally evaluated the patient during the encounter.   EKG Interpretation None      Pt is a 82 y.o. female with history of previous stroke with right-sided weakness who presents emergency department after she felt writing out of the car. Was just in the emergency department for vertigo. Had a negative MRI of her brain, labs and urine at that time. Was able to angulate prior to discharge with her walker. Family reports patient lost her balance when trying to get out of the vehicle. Denies any dizziness or preceding symptoms. Hit her lower back but did not strike her head. CT showed no acute abnormality. Plans to pain control, ambulate patient.   Layla MawKristen N Ward, DO 01/07/16 [phone]

## 2016-01-07 NOTE — ED Notes (Signed)
Pt is back from MRI, EMT at the bedside helping pt to void

## 2016-01-07 NOTE — ED Triage Notes (Signed)
Pt comes via [facility], pt was just discharged, pt was getting out of car at home and slipped on the ground, c/o thoracic back pain, denies dizziness or LOC

## 2016-01-08 LAB — URINE CULTURE

## 2017-04-10 ENCOUNTER — Emergency Department (HOSPITAL_COMMUNITY)
Admission: EM | Admit: 2017-04-10 | Discharge: 2017-04-11 | Disposition: A | Discharge status: Home / Self Care | Payer: Medicare Other | Attending: Emergency Medicine | Admitting: Emergency Medicine

## 2017-04-10 DIAGNOSIS — Z79899 Other long term (current) drug therapy: Secondary | ICD-10-CM | POA: Diagnosis not present

## 2017-04-10 DIAGNOSIS — I10 Essential (primary) hypertension: Secondary | ICD-10-CM | POA: Diagnosis not present

## 2017-04-10 DIAGNOSIS — R040 Epistaxis: Secondary | ICD-10-CM | POA: Diagnosis present

## 2017-04-10 DIAGNOSIS — Z87891 Personal history of nicotine dependence: Secondary | ICD-10-CM | POA: Diagnosis not present

## 2017-04-10 NOTE — ED Triage Notes (Signed)
Patient BIB EMS for nose bleed. Bleeding stopped before arrival. Patient has Hx of dementia but is A&O x3 at this time

## 2017-04-10 NOTE — ED Notes (Signed)
Bed: [id number] Expected date:  Expected time:  Means of arrival:  Comments: EMS 84 yo nosebleed

## 2017-04-11 NOTE — ED Provider Notes (Signed)
[facility]-EMERGENCY DEPT Provider Note   CSN: [id number] Arrival date & time: 04/10/17  2323     History   Chief Complaint Chief Complaint  Patient presents with  . Epistaxis    HPI Kaitlyn [name] is a 84 y.o. female.  HPI  84 year old female comes in with chief complaint of epistaxis.  Patient has history of hypertension and stroke.  According to family member, patient has a habit of picking her nose.  She has had nosebleeds in the past, which are normally self-limiting, however the bleeding today was severe.  Family called EMS, and en route the bleeding stopped.  Patient is not on any aspirin or any blood thinners.  Past Medical History:  Diagnosis Date  . Hypertension   . Stroke [facility](HCC)     There are no active problems to display for this patient.   Past Surgical History:  Procedure Laterality Date  . TUBAL LIGATION    . VASCULAR SURGERY       OB History   None      Home Medications    Prior to Admission medications   Medication Sig Start Date End Date Taking? Authorizing Provider  busPIRone (BUSPAR) 5 MG tablet Take 5 mg by mouth daily.  11/17/14   [provider]  ergocalciferol (VITAMIN D2) 50000 UNITS capsule Take 50,000 Units by mouth once a week. Takes on Mondays    [provider]  meclizine (ANTIVERT) 12.5 MG tablet Take 1 tablet (12.5 mg total) by mouth 3 (three) times daily as needed for dizziness. 01/07/16   Ward, Layla MawKristen N, DO  mirtazapine (REMERON) 45 MG tablet Take 45 mg by mouth at bedtime. 11/17/14   [provider]  ondansetron (ZOFRAN ODT) 4 MG disintegrating tablet Take 1 tablet (4 mg total) by mouth every 8 (eight) hours as needed for nausea or vomiting. 01/07/16   Ward, Layla MawKristen N, DO    Family History No family history on file.  Social History Social History   Tobacco Use  . Smoking status: Former Games developermoker  . Smokeless tobacco: Never Used  Substance Use Topics  . Alcohol use: No   . Drug use: No     Allergies   Other   Review of Systems Review of Systems  Constitutional: Negative for activity change.  HENT: Positive for nosebleeds.   Neurological: Negative for dizziness.  Hematological: Does not bruise/bleed easily.     Physical Exam Updated Vital Signs BP (!) 185/72 (BP Location: Left Arm)   Pulse 80   Resp 15   SpO2 100%   Physical Exam  Constitutional: She appears well-developed.  HENT:  Head: Normocephalic and atraumatic.  Right nare has a scab present anteriorly No active bleeding.  Eyes: EOM are normal.  Neck: Normal range of motion. Neck supple.  Cardiovascular: Normal rate.  Pulmonary/Chest: Effort normal.  Abdominal: Bowel sounds are normal.  Neurological: She is alert.  Skin: Skin is warm and dry.  Nursing note and vitals reviewed.    ED Treatments / Results  Labs (all labs ordered are listed, but only abnormal results are displayed) Labs Reviewed - No data to display  EKG None  Radiology No results found.  Procedures Procedures (including critical care time)  Medications Ordered in ED Medications - No data to display   Initial Impression / Assessment and Plan / ED Course  I have reviewed the triage vital signs and the nursing notes.  Pertinent labs & imaging results that were available during my  care of the patient were reviewed by me and considered in my medical decision making (see chart for details).     84 year old female comes in after she started having severe nosebleed at home.  Patient has history of epistaxis, usually self-limiting.  Etiology appears to be mechanical in nature.  Patient does not have any active bleed.  Patient has been observed in the ER for an hour and continues to do well.  Stable for d/c.  Final Clinical Impressions(s) / ED Diagnoses   Final diagnoses:  Epistaxis    ED Discharge Orders    None       Kaitlyn Kaplan, MD 04/11/17 [phone]

## 2017-04-11 NOTE — Discharge Instructions (Addendum)

## 2017-06-02 ENCOUNTER — Emergency Department (HOSPITAL_COMMUNITY)
Admission: EM | Admit: 2017-06-02 | Discharge: 2017-06-02 | Disposition: A | Discharge status: Home / Self Care | Payer: Medicare Other | Attending: Emergency Medicine | Admitting: Emergency Medicine

## 2017-06-02 ENCOUNTER — Other Ambulatory Visit: Payer: Self-pay

## 2017-06-02 ENCOUNTER — Encounter (HOSPITAL_COMMUNITY): Payer: Self-pay

## 2017-06-02 DIAGNOSIS — I10 Essential (primary) hypertension: Secondary | ICD-10-CM | POA: Diagnosis not present

## 2017-06-02 DIAGNOSIS — R040 Epistaxis: Secondary | ICD-10-CM | POA: Insufficient documentation

## 2017-06-02 DIAGNOSIS — Z79899 Other long term (current) drug therapy: Secondary | ICD-10-CM | POA: Diagnosis not present

## 2017-06-02 DIAGNOSIS — G309 Alzheimer's disease, unspecified: Secondary | ICD-10-CM | POA: Insufficient documentation

## 2017-06-02 HISTORY — DX: Dementia in other diseases classified elsewhere, unspecified severity, without behavioral disturbance, psychotic disturbance, mood disturbance, and anxiety: F02.80

## 2017-06-02 HISTORY — DX: Alzheimer's disease, unspecified: G30.9

## 2017-06-02 NOTE — ED Triage Notes (Addendum)
Per EMS- Patient called EMS for epistaxis, but EMS arrived the bleeding had stopped. Patient's family wanted the patient to be brought to the Ed for further evaluation. Family reports that the patient had a nosebleed 2 weeks ago as well. Patient denies dizziness, N/V, or headache. Patient has a history of alzheimer's.  Patient's daughter reports that the patient "picks her nose a lot."

## 2017-06-02 NOTE — ED Notes (Signed)
Bed: WTR5 Expected date:  Expected time:  Means of arrival:  Comments: 

## 2017-06-02 NOTE — ED Provider Notes (Signed)
[facility]-EMERGENCY DEPT Provider Note   CSN: [id number] Arrival date & time: 06/02/17  1025     History   Chief Complaint Chief Complaint  Patient presents with  . Epistaxis    HPI Kaitlyn [name] is a 84 y.o. female.  84 year old female here complaining of nosebleed that began this morning.  Patient has history of Alzheimer's and according to her daughter she frequently picks her nose.  Patient's daughter control the bleeding with direct pressure and ice.  There is no active bleeding at this time.  She does not take any blood thinners.  No recent history of URI symptoms.     Past Medical History:  Diagnosis Date  . Alzheimer's disease   . Hypertension   . Stroke [facility])     There are no active problems to display for this patient.   Past Surgical History:  Procedure Laterality Date  . TUBAL LIGATION    . VASCULAR SURGERY       OB History   None      Home Medications    Prior to Admission medications   Medication Sig Start Date End Date Taking? Authorizing Provider  busPIRone (BUSPAR) 5 MG tablet Take 5 mg by mouth daily.  11/17/14   [provider]  ergocalciferol (VITAMIN D2) 50000 UNITS capsule Take 50,000 Units by mouth once a week. Takes on Mondays    [provider]  meclizine (ANTIVERT) 12.5 MG tablet Take 1 tablet (12.5 mg total) by mouth 3 (three) times daily as needed for dizziness. 01/07/16   Ward, Layla Maw, DO  mirtazapine (REMERON) 45 MG tablet Take 45 mg by mouth at bedtime. 11/17/14   [provider]  ondansetron (ZOFRAN ODT) 4 MG disintegrating tablet Take 1 tablet (4 mg total) by mouth every 8 (eight) hours as needed for nausea or vomiting. 01/07/16   Ward, Layla Maw, DO    Family History History reviewed. No pertinent family history.  Social History Social History   Tobacco Use  . Smoking status: Former Games developer  . Smokeless tobacco: Never Used  Substance Use Topics  . Alcohol use:  No  . Drug use: No     Allergies   Other   Review of Systems Review of Systems  All other systems reviewed and are negative.    Physical Exam Updated Vital Signs BP (!) 146/89 (BP Location: Right Arm)   Pulse 72   Temp 98 F (36.7 C) (Oral)   Resp 16   Ht 1.549 m ( )   Wt 68 kg (150 lb)   SpO2 100%   BMI 28.34 kg/m   Physical Exam  Constitutional: She is oriented to person, place, and time. She appears well-developed and well-nourished.  Non-toxic appearance. No distress.  HENT:  Head: Normocephalic and atraumatic.  Nose: No epistaxis.  Eyes: Pupils are equal, round, and reactive to light. Conjunctivae, EOM and lids are normal.  Neck: Normal range of motion. Neck supple. No tracheal deviation present. No thyroid mass present.  Cardiovascular: Normal rate, regular rhythm and normal heart sounds. Exam reveals no gallop.  No murmur heard. Pulmonary/Chest: Effort normal and breath sounds normal. No stridor. No respiratory distress. She has no decreased breath sounds. She has no wheezes. She has no rhonchi. She has no rales.  Abdominal: Soft. Normal appearance and bowel sounds are normal. She exhibits no distension. There is no tenderness. There is no rebound and no CVA tenderness.  Musculoskeletal: Normal range of motion. She exhibits  no edema or tenderness.  Neurological: She is alert and oriented to person, place, and time. She has normal strength. No cranial nerve deficit or sensory deficit. GCS eye subscore is 4. GCS verbal subscore is 5. GCS motor subscore is 6.  Skin: Skin is warm and dry. No abrasion and no rash noted.  Psychiatric: She has a normal mood and affect. Her speech is normal and behavior is normal.  Nursing note and vitals reviewed.    ED Treatments / Results  Labs (all labs ordered are listed, but only abnormal results are displayed) Labs Reviewed - No data to display  EKG None  Radiology No results found.  Procedures Procedures  (including critical care time)  Medications Ordered in ED Medications - No data to display   Initial Impression / Assessment and Plan / ED Course  I have reviewed the triage vital signs and the nursing notes.  Pertinent labs & imaging results that were available during my care of the patient were reviewed by me and considered in my medical decision making (see chart for details).     Patient has no signs of active bleeding at this time.  Daughter offered prophylactic nasal packing and she has deferred due to the patient's history of dementia.  Return precautions given  Final Clinical Impressions(s) / ED Diagnoses   Final diagnoses:  None    ED Discharge Orders    None       Lorre Nick, MD 06/02/17 1139

## 2017-11-06 IMAGING — MR MR HEAD W/O CM
9 of 10 series · 35 of 48 positions shown · non-contrast
Comparison: Prior CT from 12/24/2009.

CLINICAL DATA: Initial evaluation for acute vertigo.

EXAM:
MRI HEAD WITHOUT CONTRAST
TECHNIQUE: Multiplanar, multiecho pulse sequences of the brain and surrounding
structures were obtained without intravenous contrast.

[Series 2: FLAIR · sagittal · 5.0mm · 0.47mm/px · 3 of 21 slices shown (1 of 2)]
[im 1/21]
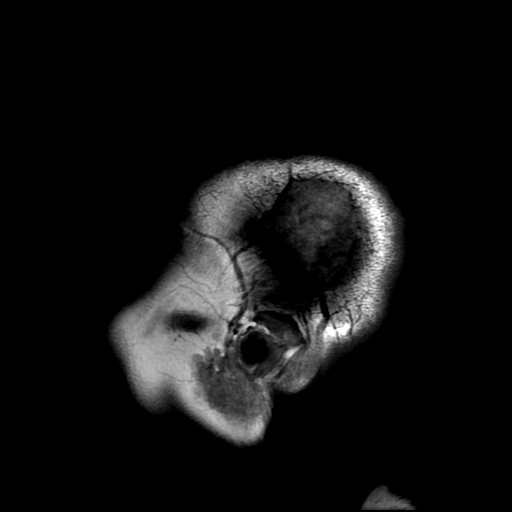
[im 11/21]
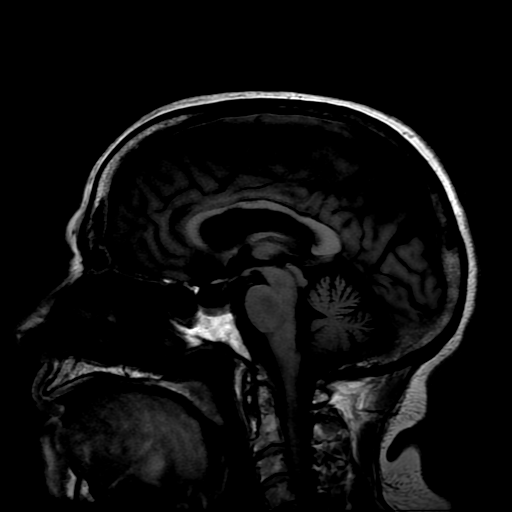
[im 21/21]
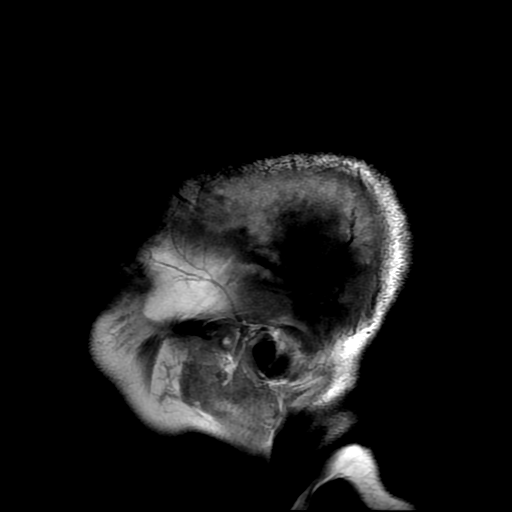

[Series 4: DWI · axial · 3.0mm · 0.94mm/px · z∈[-10,+128]mm · 9 of 94 slices shown (1 of 2)]
[im 1/94]
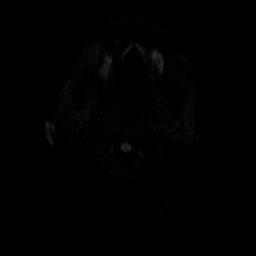
[im 12/94]
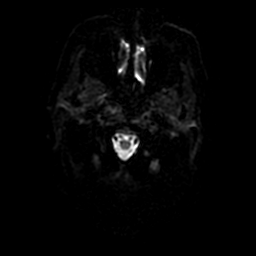
[im 24/94]
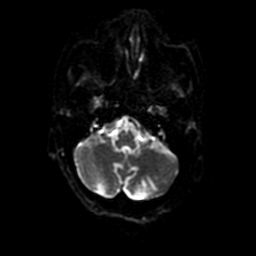
[im 35/94]
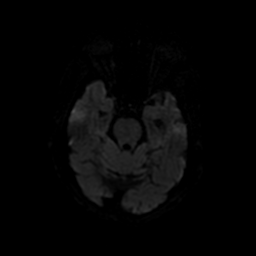
[im 47/94]
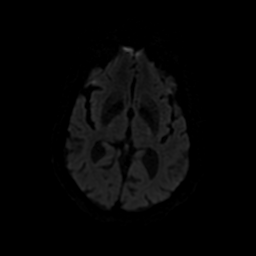
[im 59/94]
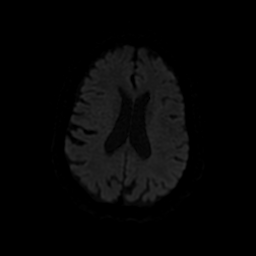
[im 70/94]
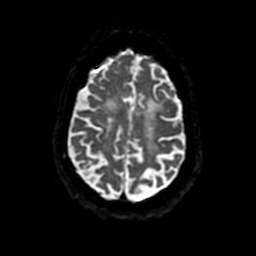
[im 82/94]
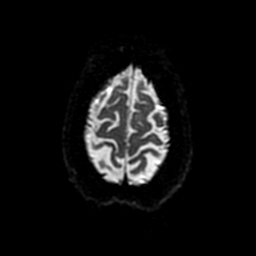
[im 94/94]
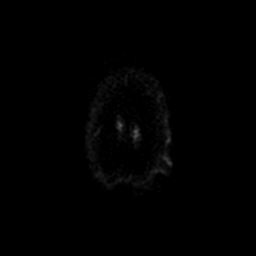

[Series 6: T2 · axial · 5.0mm · 0.43mm/px · z∈[-10,+128]mm · 2 of 24 slices shown (1 of 2)]
[im 1/24]
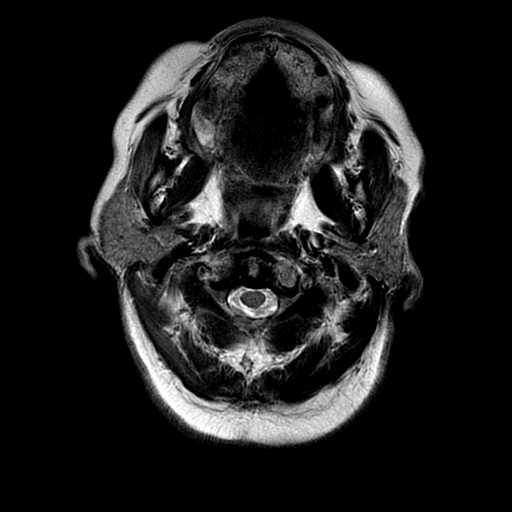
[im 24/24]
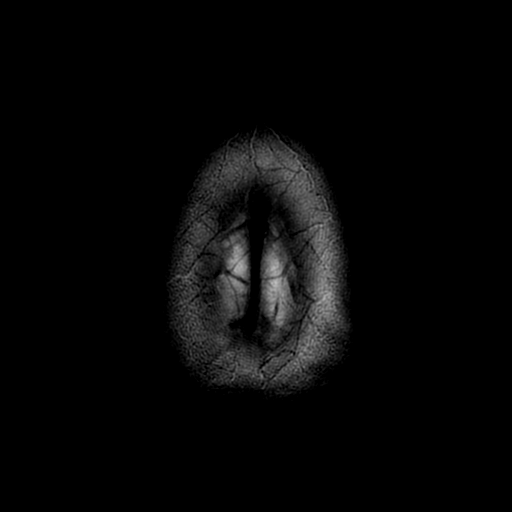

[Series 7: FLAIR · axial · 5.0mm · 0.43mm/px · z∈[-10,+128]mm · 2 of 24 slices shown (2 of 2)]
[im 1/24]
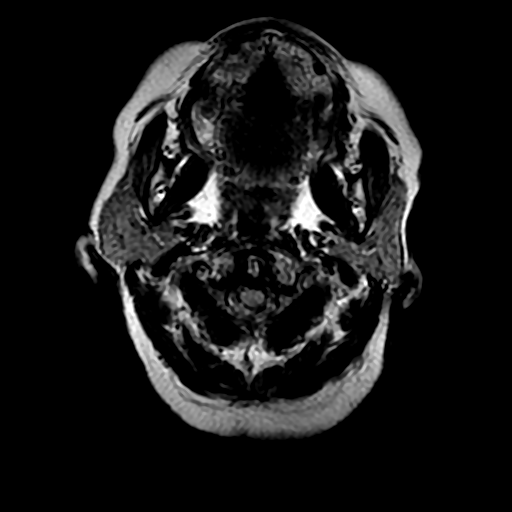
[im 24/24]
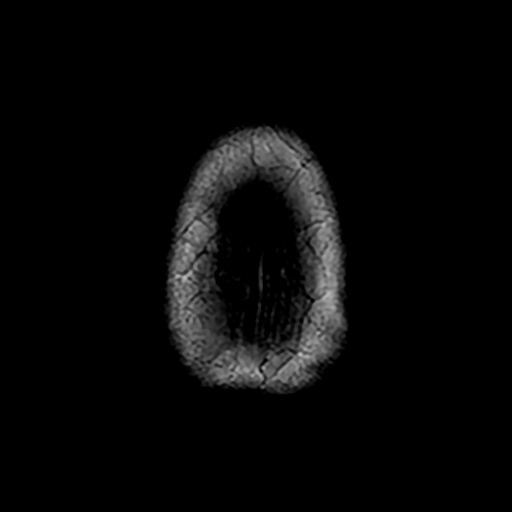

[Series 8: DWI · coronal · 4.0mm · 0.94mm/px · 7 of 70 slices shown (2 of 2)]
[im 1/70]
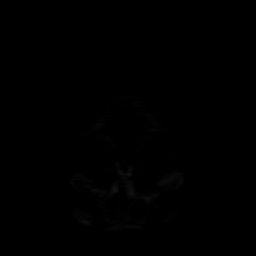
[im 12/70]
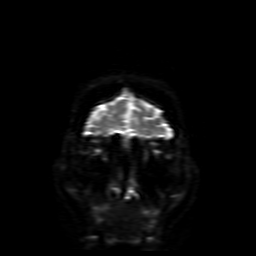
[im 24/70]
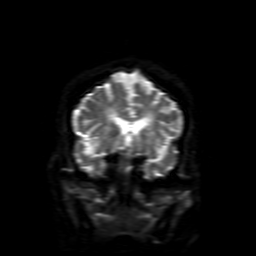
[im 35/70]
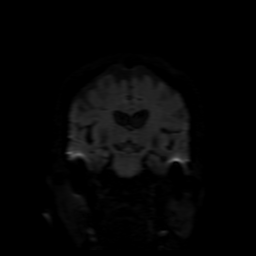
[im 47/70]
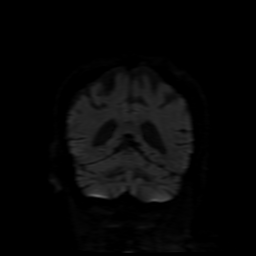
[im 58/70]
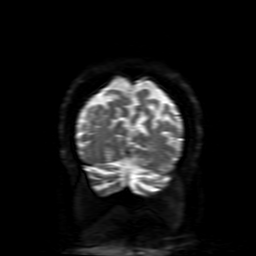
[im 70/70]
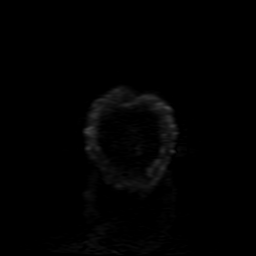

[Series 9: (person_name) · axial · 3.0mm · 0.47mm/px · 1 of 96 slices shown]
[im 1/96]
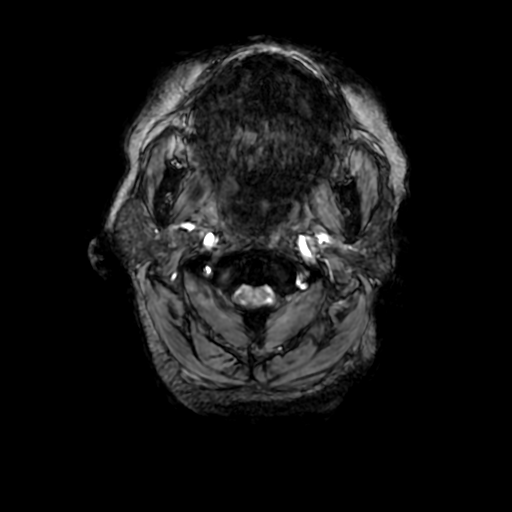

[Series 11: T2 · coronal · 5.0mm · 0.39mm/px · 3 of 29 slices shown (2 of 2)]
[im 1/29]
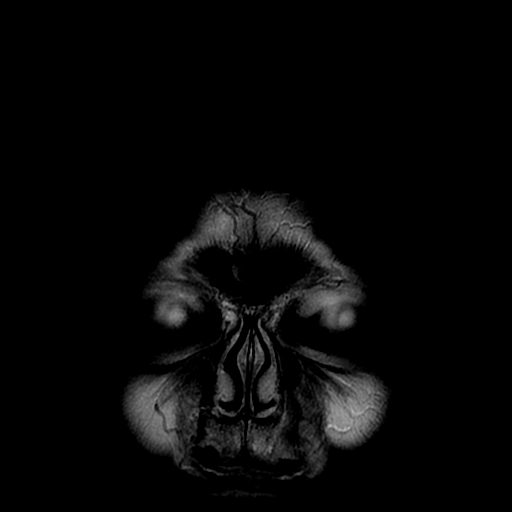
[im 15/29]
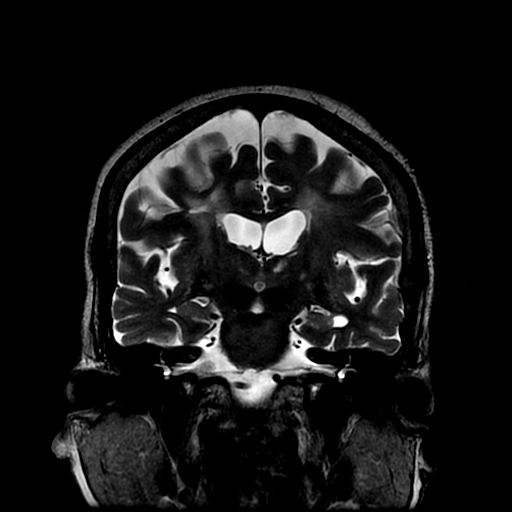
[im 29/29]
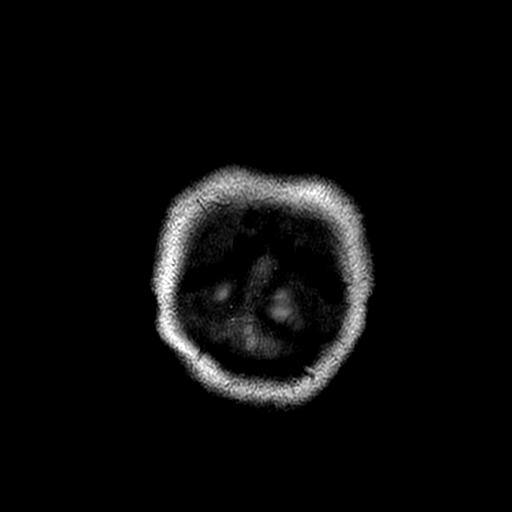

[Series 450: ADC · axial · 3.0mm · 0.94mm/px · z∈[-10,+128]mm · 5 of 47 slices shown (1 of 2)]
[im 1/47]
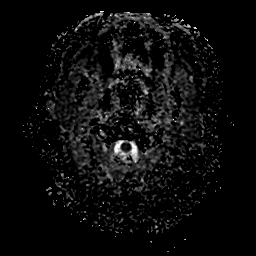
[im 12/47]
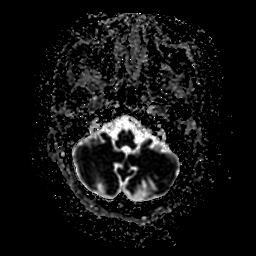
[im 24/47]
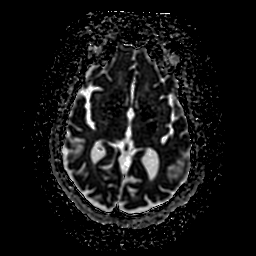
[im 35/47]
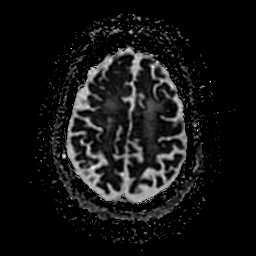
[im 47/47]
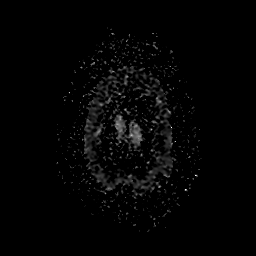

[Series 850: ADC · coronal · 4.0mm · 0.94mm/px · 3 of 35 slices shown (2 of 2)]
[im 1/35]
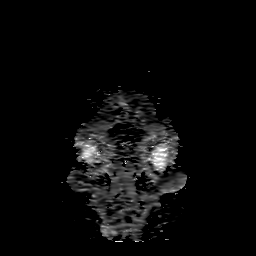
[im 18/35]
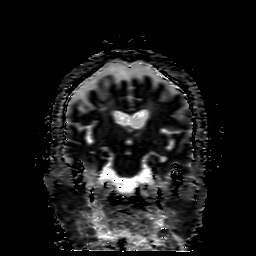
[im 35/35]
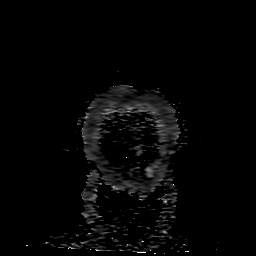

[35 of 48 positions shown; findings below may reference images not displayed]

FINDINGS: Brain: Diffuse prominence of the CSF containing spaces is compatible
with generalized age-related cerebral atrophy. Patchy T2/FLAIR
hyperintensity within the periventricular and deep white matter both
cerebral hemispheres most compatible with chronic small vessel
ischemic disease, moderate nature. Chronic microvascular ischemic
changes present within the pons.

Multiple remote bilateral cerebellar infarcts are present. There is
a remote lacunar infarct involving the left lateral medulla. Remote
lacunar infarcts within the left thalamus and right corona radiata.
Remote cortical infarct within the right parietal lobe.

No abnormal foci of restricted diffusion to suggest acute or
subacute ischemia. Gray-white matter differentiation maintained. No
acute or chronic intracranial hemorrhage.

No mass lesion, midline shift, or mass effect. Ventricular
prominence related to global parenchymal volume loss without
hydrocephalus. No extra-axial fluid collection. Major dural sinuses
are grossly patent.

Incidental note made of a partially empty sella.

Vascular: Major intracranial vascular flow voids are maintained.

Skull and upper cervical spine: Craniocervical junction within
normal limits. Mild multilevel degenerative spondylolysis noted
within the visualized upper cervical spine without significant
stenosis. Bone marrow signal intensity within normal limits. No
scalp soft tissue abnormality.

Sinuses/Orbits: The globes and orbital soft tissues within normal
limits. Patient is status post cataract extraction bilaterally.

Paranasal sinuses are clear. No mastoid effusion. Inner ear
structures grossly normal.
IMPRESSION: 1. No acute intracranial process identified.
2. Generalized age-related cerebral atrophy with moderate chronic
microvascular ischemic disease and multiple remote infarcts as
above.

## 2017-11-06 IMAGING — DX DG CHEST 2V
2 series · 2 of 2 positions shown · non-contrast
Comparison: Chest radiograph dated 12/26/2014

CLINICAL DATA: 89-year-old female with sudden onset dizziness and
shortness of breath.

EXAM:
CHEST  2 VIEW

[chest lat]
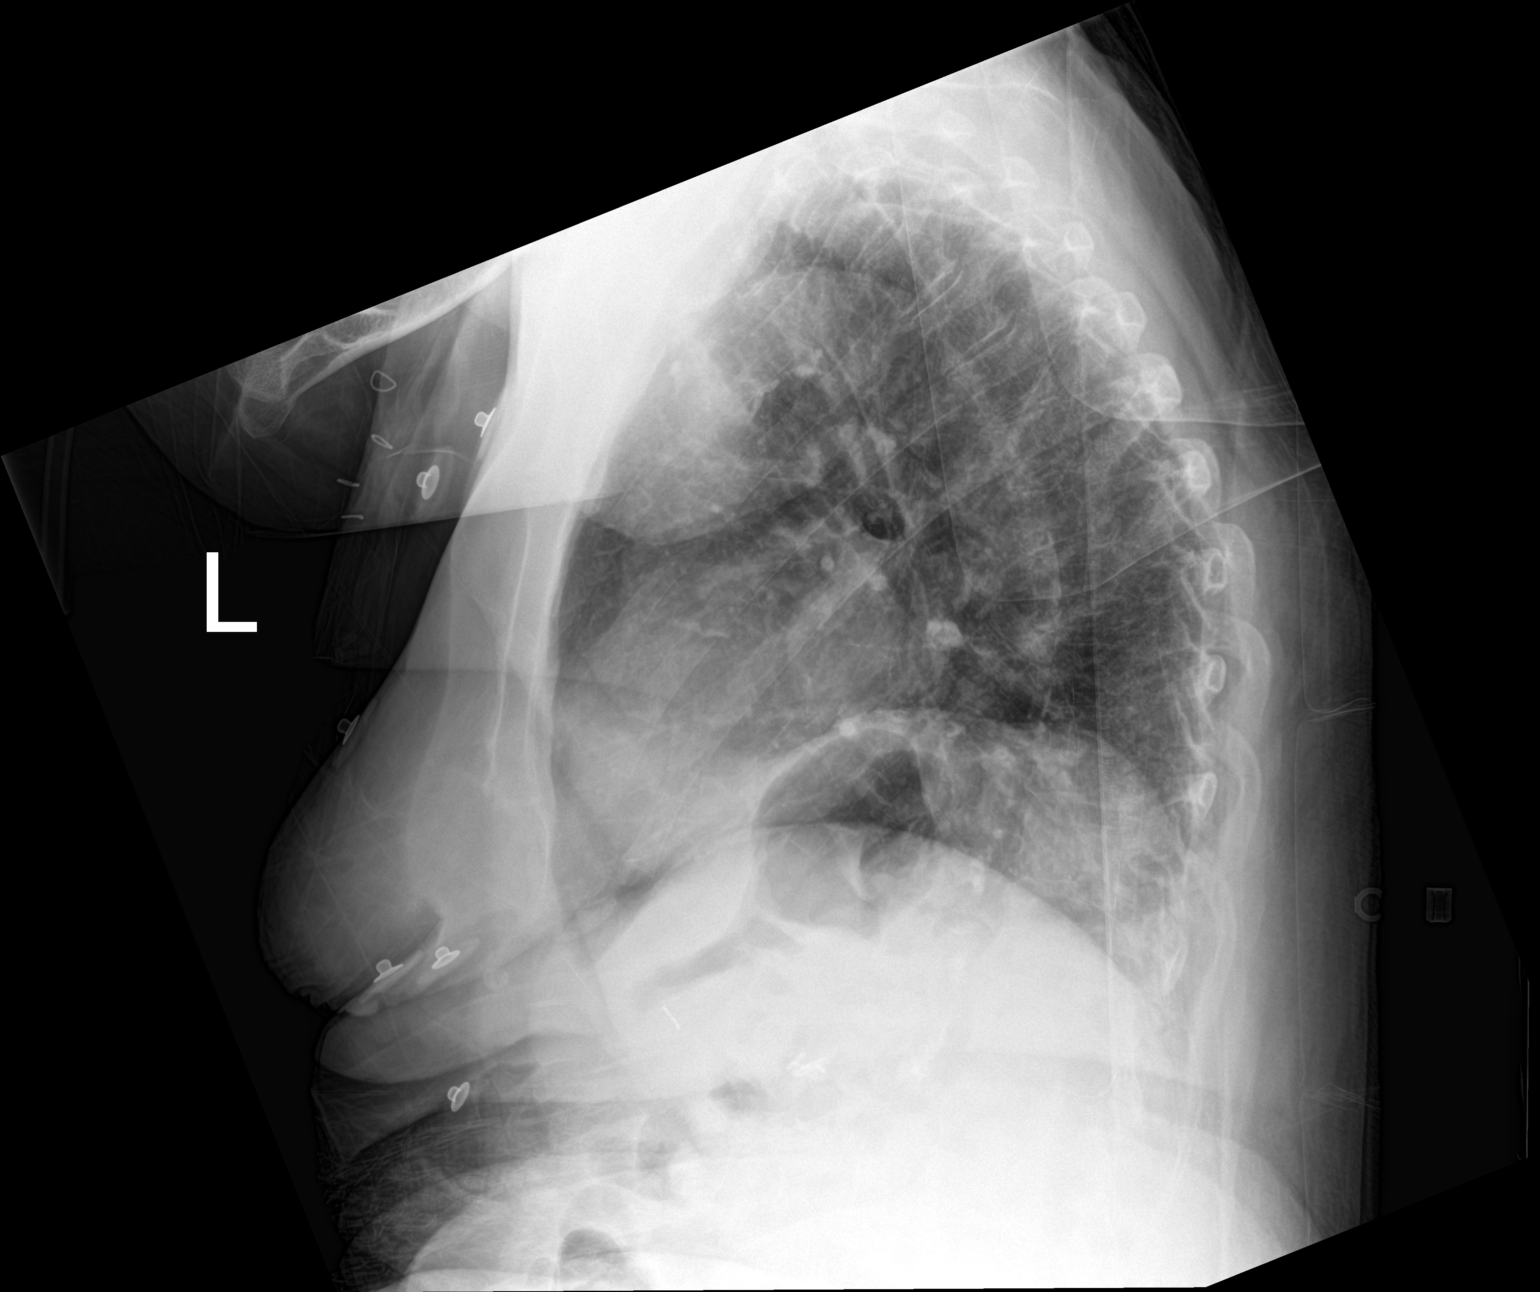

[chest ap]
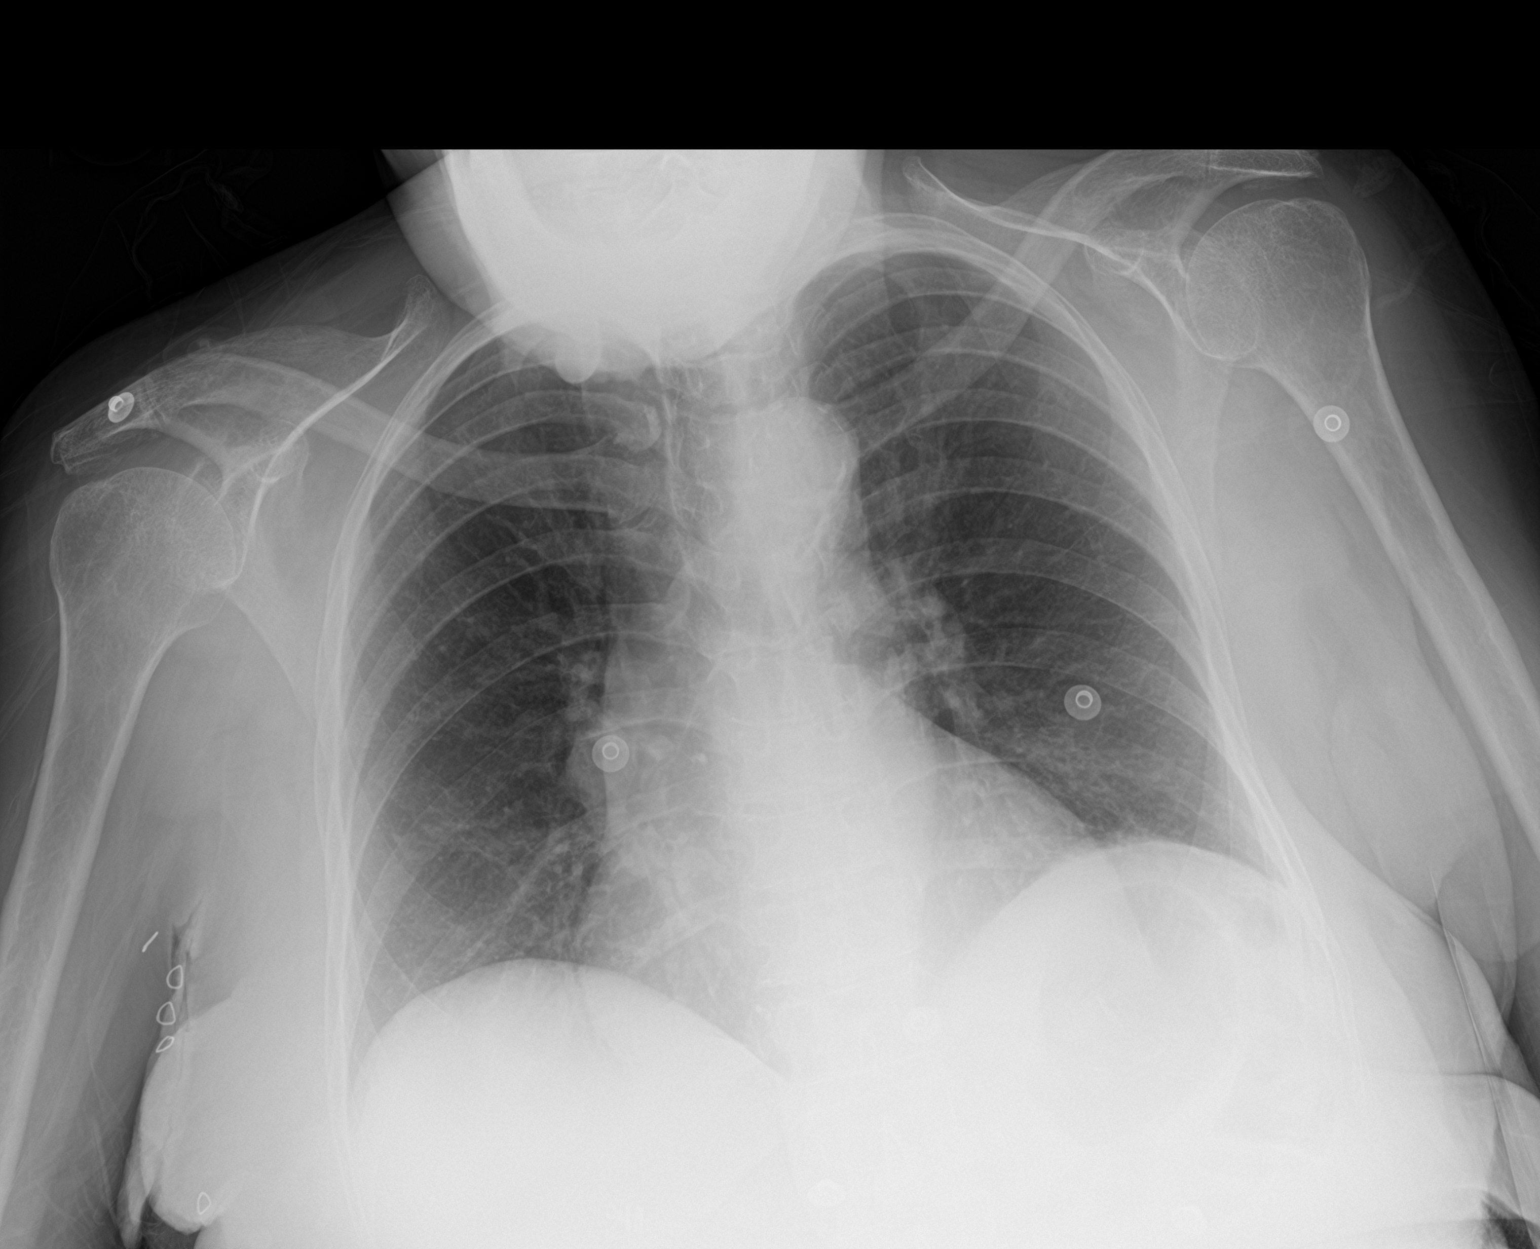

[2 of 2 positions shown; findings below may reference images not displayed]

FINDINGS: The lungs are clear. There is no pleural effusion or pneumothorax.
The cardiac silhouette is within normal limits. No acute osseous
pathology.
IMPRESSION: No active cardiopulmonary disease.

## 2018-04-21 ENCOUNTER — Emergency Department (HOSPITAL_COMMUNITY): Payer: Medicare Other

## 2018-04-21 ENCOUNTER — Encounter (HOSPITAL_COMMUNITY): Payer: Self-pay

## 2018-04-21 ENCOUNTER — Emergency Department (HOSPITAL_COMMUNITY)
Admission: EM | Admit: 2018-04-21 | Discharge: 2018-04-21 | Disposition: A | Discharge status: Home / Self Care | Payer: Medicare Other | Attending: Emergency Medicine | Admitting: Emergency Medicine

## 2018-04-21 DIAGNOSIS — Z87891 Personal history of nicotine dependence: Secondary | ICD-10-CM | POA: Diagnosis not present

## 2018-04-21 DIAGNOSIS — T17320A Food in larynx causing asphyxiation, initial encounter: Secondary | ICD-10-CM | POA: Insufficient documentation

## 2018-04-21 DIAGNOSIS — G309 Alzheimer's disease, unspecified: Secondary | ICD-10-CM | POA: Diagnosis not present

## 2018-04-21 DIAGNOSIS — I10 Essential (primary) hypertension: Secondary | ICD-10-CM | POA: Diagnosis not present

## 2018-04-21 DIAGNOSIS — Y999 Unspecified external cause status: Secondary | ICD-10-CM | POA: Insufficient documentation

## 2018-04-21 DIAGNOSIS — T17308A Unspecified foreign body in larynx causing other injury, initial encounter: Secondary | ICD-10-CM

## 2018-04-21 DIAGNOSIS — X58XXXA Exposure to other specified factors, initial encounter: Secondary | ICD-10-CM | POA: Diagnosis not present

## 2018-04-21 DIAGNOSIS — Z79899 Other long term (current) drug therapy: Secondary | ICD-10-CM | POA: Insufficient documentation

## 2018-04-21 DIAGNOSIS — Y939 Activity, unspecified: Secondary | ICD-10-CM | POA: Insufficient documentation

## 2018-04-21 DIAGNOSIS — Y929 Unspecified place or not applicable: Secondary | ICD-10-CM | POA: Insufficient documentation

## 2018-04-21 LAB — COMPREHENSIVE METABOLIC PANEL
ALT: 95 U/L — ABNORMAL HIGH (ref 0–44)
AST: 182 U/L — ABNORMAL HIGH (ref 15–41)
Albumin: 3.4 g/dL — ABNORMAL LOW (ref 3.5–5.0)
Alkaline Phosphatase: 131 U/L — ABNORMAL HIGH (ref 38–126)
Anion gap: 7 (ref 5–15)
BUN: 19 mg/dL (ref 8–23)
CO2: 27 mmol/L (ref 22–32)
Calcium: 9.1 mg/dL (ref 8.9–10.3)
Chloride: 105 mmol/L (ref 98–111)
Creatinine, Ser: 1.02 mg/dL — ABNORMAL HIGH (ref 0.44–1.00)
GFR calc Af Amer: 56 mL/min — ABNORMAL LOW (ref >60)
GFR calc non Af Amer: 48 mL/min — ABNORMAL LOW (ref >60)
Glucose, Bld: 212 mg/dL — ABNORMAL HIGH (ref 70–99)
Potassium: 3.6 mmol/L (ref 3.5–5.1)
Sodium: 139 mmol/L (ref 135–145)
Total Bilirubin: 0.8 mg/dL (ref 0.3–1.2)
Total Protein: 6.2 g/dL — ABNORMAL LOW (ref 6.5–8.1)

## 2018-04-21 LAB — CBC
HCT: 40.1 % (ref 36.0–46.0)
Hemoglobin: 12.7 g/dL (ref 12.0–15.0)
MCH: 31.5 pg (ref 26.0–34.0)
MCHC: 31.7 g/dL (ref 30.0–36.0)
MCV: 99.5 fL (ref 80.0–100.0)
Platelets: 113 10*3/uL — ABNORMAL LOW (ref 150–400)
RBC: 4.03 MIL/uL (ref 3.87–5.11)
RDW: 13 % (ref 11.5–15.5)
WBC: 8.3 10*3/uL (ref 4.0–10.5)
nRBC: 0 % (ref 0.0–0.2)

## 2018-04-21 LAB — I-STAT TROPONIN, ED: Troponin i, poc: 0.01 ng/mL (ref 0.00–0.08)

## 2018-04-21 NOTE — ED Provider Notes (Signed)
[facility] EMERGENCY DEPARTMENT Provider Note   CSN: [id number] Arrival date & time: 04/21/18  1406    History   Chief Complaint Chief Complaint  Patient presents with  . Choking    HPI Kaitlyn [name] is a 85 y.o. female.     The history is provided by the patient, medical records and the EMS personnel. No language interpreter was used.   Kaitlyn [name] is a 85 y.o. female  with a PMH of Alzheimer's, prior stroke, htn who presents to the Emergency Department from home for choking. Daughter reports that patient was eating a strawberry. Strawberry was cut up, but she put the whole thing in her mouth and immediately began choking. Family did heimlich, but nothing came up. EMS was called who also did heimlich and felt as if she became better. Family reports that she is at her baseline mental status. EMS reports that she was 90% O2 in field, so they put her on non-breather and she immediately bumped up to 98%.   Level V caveat applies 2/2 mental status change.    Past Medical History:  Diagnosis Date  . Alzheimer's disease (HCC)   . Hypertension   . Stroke [facility])     There are no active problems to display for this patient.   Past Surgical History:  Procedure Laterality Date  . TUBAL LIGATION    . VASCULAR SURGERY       OB History   No obstetric history on file.      Home Medications    Prior to Admission medications   Medication Sig Start Date End Date Taking? Authorizing Provider  busPIRone (BUSPAR) 5 MG tablet Take 5 mg by mouth daily.  11/17/14   [provider]  ergocalciferol (VITAMIN D2) 50000 UNITS capsule Take 50,000 Units by mouth once a week. Takes on Mondays    [provider]  meclizine (ANTIVERT) 12.5 MG tablet Take 1 tablet (12.5 mg total) by mouth 3 (three) times daily as needed for dizziness. 01/07/16   Sasan Wilkie, Layla Maw, DO  mirtazapine (REMERON) 45 MG tablet Take 45 mg by mouth at bedtime. 11/17/14    [provider]  ondansetron (ZOFRAN ODT) 4 MG disintegrating tablet Take 1 tablet (4 mg total) by mouth every 8 (eight) hours as needed for nausea or vomiting. 01/07/16   Keneisha Heckart, Layla Maw, DO    Family History No family history on file.  Social History Social History   Tobacco Use  . Smoking status: Former Games developer  . Smokeless tobacco: Never Used  Substance Use Topics  . Alcohol use: No  . Drug use: No     Allergies   Other   Review of Systems Review of Systems  Unable to perform ROS: Dementia     Physical Exam Updated Vital Signs Temp 98.1 F (36.7 C) (Oral)   Ht 5\' 1"  (1.549 m)   Wt 68 kg   SpO2 93%   BMI 28.33 kg/m   Physical Exam Vitals signs and nursing note reviewed.  Constitutional:      General: She is not in acute distress.    Appearance: She is well-developed.  HENT:     Head: Normocephalic and atraumatic.  Neck:     Musculoskeletal: Neck supple.  Cardiovascular:     Rate and Rhythm: Normal rate and regular rhythm.     Heart sounds: Normal heart sounds. No murmur.  Pulmonary:     Effort: Pulmonary effort is normal. No respiratory distress.  Breath sounds: Normal breath sounds.  Abdominal:     General: There is no distension.     Palpations: Abdomen is soft.     Tenderness: There is no abdominal tenderness.  Skin:    General: Skin is warm and dry.  Neurological:     Mental Status: She is alert. Mental status is at baseline.      ED Treatments / Results  Labs (all labs ordered are listed, but only abnormal results are displayed) Labs Reviewed - No data to display  EKG None  Radiology No results found.  Procedures Procedures (including critical care time)  Medications Ordered in ED Medications - No data to display   Initial Impression / Assessment and Plan / ED Course  I have reviewed the triage vital signs and the nursing notes.  Pertinent labs & imaging results that were available during my care of the patient  were reviewed by me and considered in my medical decision making (see chart for details).       Kaitlyn [name] is a 85 y.o. female who presents to ED for evaluation after choking on strawberry just prior to arrival. Intermittently clearing her throat, but tolerating secretions well. Plan for labs, cxr and re-evaluation. Pending at shift change. Care assumed by oncoming provider, Dr. Adela Lank. Case discussed and plan agreed upon.   Patient seen by and discussed with Dr. Jacqulyn Bath who agrees with treatment plan.    Final Clinical Impressions(s) / ED Diagnoses   Final diagnoses:  Choking    ED Discharge Orders    None       Vollie Brunty, Chase Picket, PA-C 04/21/18 1523    Maia Plan, MD 04/21/18 [phone]

## 2018-04-21 NOTE — ED Notes (Signed)
Patient transported to X-ray 

## 2018-04-21 NOTE — ED Notes (Signed)
Discharge instructions given to PTAR . 

## 2018-04-21 NOTE — ED Triage Notes (Signed)
Pt from home via ems; family reports pt choked on strawberry this afternoon; heimlich preformed by family; FD did chest trusts, pt noted to be drooling; diminished lund sounds w/ ems; 90% RA; pt normally communicates verbally per family, now communicating w/ one word answers; ems reports less drooling noted now, breathing has normalized; hx stroke  HR 92 90% RA, 98% NRB 120/84 CBG 188

## 2018-04-21 NOTE — ED Provider Notes (Signed)
I received the patient in signout from Dr. Jacqulyn Bath.  Briefly the patient is a 85 year old female who had a choking episode while eating strawberries.  Heimlich maneuver was performed patient was breathing better though per EMS her oxygen saturation was in the 90s.  Low 90s on arrival here.  Had persistent clearing of her throat.  Plan was for x-ray basic lab work and reassessment.  Chest x-ray viewed by me without atelectasis or infiltrate.  Lab work with no significant change from prior.  Troponin is negative patient is not anemic.  Repeat assessment: patient has no difficulty breathing no throat clearing her oxygen saturation is now 97% on room air.  She is in no distress.  We will discharge the patient home.  Have her follow-up with her PCP.   Melene Plan, DO 04/21/18 1625

## 2018-04-21 NOTE — Discharge Instructions (Signed)
Return for worsening shortness of breath, fever.

## 2018-04-21 NOTE — ED Notes (Signed)
Spoke with pt's daughter Teryl Lucy) to let her know that pt is being discharged and will be transported home via [facility]

## 2019-01-18 DEATH — deceased
